# Patient Record
Sex: Female | Born: 1985 | Race: White | Hispanic: No | Marital: Married | Smoking: Never smoker
Health system: Southern US, Community
[De-identification: ages and names within clinical notes are randomized; demographics above are authoritative.]

## PROBLEM LIST (undated history)

## (undated) ENCOUNTER — Inpatient Hospital Stay (HOSPITAL_COMMUNITY): Payer: Self-pay

## (undated) DIAGNOSIS — O24419 Gestational diabetes mellitus in pregnancy, unspecified control: Secondary | ICD-10-CM

## (undated) DIAGNOSIS — I10 Essential (primary) hypertension: Secondary | ICD-10-CM

## (undated) HISTORY — PX: WISDOM TOOTH EXTRACTION: SHX21

---

## 2018-11-16 NOTE — L&D Delivery Note (Deleted)
Pt was admitted after she underwent a successful version. She was then started on pit. She rapidly completed the first stage. She had SROM with clear fluid. During the second stage she developed deep varible decels. The VE was placed at +2 station OP. She delivered with one ctx. The baby rotated to OA during the delivery. Nuchal cord x 1. Placenta- S/I EBL-400cc. Baby to NBN 

## 2019-03-01 LAB — OB RESULTS CONSOLE HEPATITIS B SURFACE ANTIGEN: Hepatitis B Surface Ag: NEGATIVE

## 2019-03-01 LAB — OB RESULTS CONSOLE GC/CHLAMYDIA
Chlamydia: NEGATIVE
Gonorrhea: NEGATIVE

## 2019-03-01 LAB — OB RESULTS CONSOLE HIV ANTIBODY (ROUTINE TESTING): HIV: NONREACTIVE

## 2019-03-01 LAB — OB RESULTS CONSOLE ANTIBODY SCREEN: Antibody Screen: NEGATIVE

## 2019-03-01 LAB — OB RESULTS CONSOLE ABO/RH: RH Type: POSITIVE

## 2019-03-01 LAB — OB RESULTS CONSOLE RPR: RPR: NONREACTIVE

## 2019-07-07 ENCOUNTER — Inpatient Hospital Stay (HOSPITAL_COMMUNITY)
Admission: AD | Admit: 2019-07-07 | Payer: Managed Care, Other (non HMO) | Source: Home / Self Care | Admitting: Obstetrics and Gynecology

## 2019-07-26 ENCOUNTER — Other Ambulatory Visit: Payer: Self-pay

## 2019-07-26 ENCOUNTER — Encounter: Payer: Managed Care, Other (non HMO) | Attending: Obstetrics and Gynecology | Admitting: Registered"

## 2019-07-26 ENCOUNTER — Encounter: Payer: Self-pay | Admitting: Registered"

## 2019-07-26 DIAGNOSIS — O99810 Abnormal glucose complicating pregnancy: Secondary | ICD-10-CM | POA: Insufficient documentation

## 2019-07-26 NOTE — Progress Notes (Signed)
Patient was seen on 07/26/19 for Gestational Diabetes self-management class at the [facility]. The following learning objectives were met by the patient during this course:   States the definition of Gestational Diabetes  States why dietary management is important in controlling blood glucose  Describes the effects each nutrient has on blood glucose levels  Demonstrates ability to create a balanced meal plan  Demonstrates carbohydrate counting   States when to check blood glucose levels  Demonstrates proper blood glucose monitoring techniques  States the effect of stress and exercise on blood glucose levels  States the importance of limiting caffeine and abstaining from alcohol and smoking  Blood glucose monitor given: [employer] Lot # [id number] X Exp: 01/14/20 Blood glucose reading: 83 mg/dL  Patient instructed to monitor glucose levels: FBS: 60 - <95; 1 hour: <140; 2 hour: <120  Patient received handouts:  Nutrition Diabetes and Pregnancy, including carb counting list  Patient will be seen for follow-up as needed.

## 2019-08-28 ENCOUNTER — Inpatient Hospital Stay (HOSPITAL_COMMUNITY)
Admission: AD | Admit: 2019-08-28 | Discharge: 2019-09-01 | DRG: 832 | Disposition: A | Discharge status: Home / Self Care | Payer: Managed Care, Other (non HMO) | Attending: Obstetrics | Admitting: Obstetrics

## 2019-08-28 ENCOUNTER — Inpatient Hospital Stay (HOSPITAL_COMMUNITY)
Admission: AD | Admit: 2019-08-28 | Payer: Managed Care, Other (non HMO) | Source: Home / Self Care | Admitting: Obstetrics & Gynecology

## 2019-08-28 ENCOUNTER — Other Ambulatory Visit: Payer: Self-pay

## 2019-08-28 ENCOUNTER — Encounter (HOSPITAL_COMMUNITY): Payer: Self-pay | Admitting: *Deleted

## 2019-08-28 DIAGNOSIS — O10013 Pre-existing essential hypertension complicating pregnancy, third trimester: Secondary | ICD-10-CM | POA: Diagnosis present

## 2019-08-28 DIAGNOSIS — O4100X0 Oligohydramnios, unspecified trimester, not applicable or unspecified: Secondary | ICD-10-CM

## 2019-08-28 DIAGNOSIS — O288 Other abnormal findings on antenatal screening of mother: Secondary | ICD-10-CM

## 2019-08-28 DIAGNOSIS — Z3A35 35 weeks gestation of pregnancy: Secondary | ICD-10-CM | POA: Diagnosis not present

## 2019-08-28 DIAGNOSIS — O24410 Gestational diabetes mellitus in pregnancy, diet controlled: Secondary | ICD-10-CM | POA: Diagnosis present

## 2019-08-28 DIAGNOSIS — O321XX0 Maternal care for breech presentation, not applicable or unspecified: Secondary | ICD-10-CM | POA: Diagnosis present

## 2019-08-28 DIAGNOSIS — O99820 Streptococcus B carrier state complicating pregnancy: Secondary | ICD-10-CM | POA: Diagnosis present

## 2019-08-28 DIAGNOSIS — Z20828 Contact with and (suspected) exposure to other viral communicable diseases: Secondary | ICD-10-CM | POA: Diagnosis present

## 2019-08-28 DIAGNOSIS — I10 Essential (primary) hypertension: Secondary | ICD-10-CM

## 2019-08-28 DIAGNOSIS — O4103X0 Oligohydramnios, third trimester, not applicable or unspecified: Secondary | ICD-10-CM | POA: Diagnosis present

## 2019-08-28 DIAGNOSIS — O4103X1 Oligohydramnios, third trimester, fetus 1: Secondary | ICD-10-CM | POA: Diagnosis present

## 2019-08-28 DIAGNOSIS — Z3A36 36 weeks gestation of pregnancy: Secondary | ICD-10-CM | POA: Diagnosis not present

## 2019-08-28 DIAGNOSIS — O24419 Gestational diabetes mellitus in pregnancy, unspecified control: Secondary | ICD-10-CM

## 2019-08-28 HISTORY — DX: Gestational diabetes mellitus in pregnancy, unspecified control: O24.419

## 2019-08-28 HISTORY — DX: Essential (primary) hypertension: I10

## 2019-08-28 LAB — COMPREHENSIVE METABOLIC PANEL
ALT: 20 U/L (ref 0–44)
AST: 23 U/L (ref 15–41)
Albumin: 2.7 g/dL — ABNORMAL LOW (ref 3.5–5.0)
Alkaline Phosphatase: 76 U/L (ref 38–126)
Anion gap: 11 (ref 5–15)
BUN: 10 mg/dL (ref 6–20)
CO2: 18 mmol/L — ABNORMAL LOW (ref 22–32)
Calcium: 8.9 mg/dL (ref 8.9–10.3)
Chloride: 107 mmol/L (ref 98–111)
Creatinine, Ser: 0.57 mg/dL (ref 0.44–1.00)
GFR calc Af Amer: >60 mL/min (ref >60)
GFR calc non Af Amer: >60 mL/min (ref >60)
Glucose, Bld: 78 mg/dL (ref 70–99)
Potassium: 3.2 mmol/L — ABNORMAL LOW (ref 3.5–5.1)
Sodium: 136 mmol/L (ref 135–145)
Total Bilirubin: 0.5 mg/dL (ref 0.3–1.2)
Total Protein: 6.1 g/dL — ABNORMAL LOW (ref 6.5–8.1)

## 2019-08-28 LAB — CBC
HCT: 30.6 % — ABNORMAL LOW (ref 36.0–46.0)
Hemoglobin: 10.2 g/dL — ABNORMAL LOW (ref 12.0–15.0)
MCH: 32.1 pg (ref 26.0–34.0)
MCHC: 33.3 g/dL (ref 30.0–36.0)
MCV: 96.2 fL (ref 80.0–100.0)
Platelets: 226 10*3/uL (ref 150–400)
RBC: 3.18 MIL/uL — ABNORMAL LOW (ref 3.87–5.11)
RDW: 12.7 % (ref 11.5–15.5)
WBC: 9.9 10*3/uL (ref 4.0–10.5)
nRBC: 0 % (ref 0.0–0.2)

## 2019-08-28 LAB — ABO/RH: ABO/RH(D): A POS

## 2019-08-28 LAB — TYPE AND SCREEN
ABO/RH(D): A POS
Antibody Screen: NEGATIVE

## 2019-08-28 LAB — SARS CORONAVIRUS 2 BY RT PCR (HOSPITAL ORDER, PERFORMED IN ~~LOC~~ HOSPITAL LAB): SARS Coronavirus 2: NEGATIVE

## 2019-08-28 MED ORDER — DOCUSATE SODIUM 100 MG PO CAPS
100.0000 mg | ORAL_CAPSULE | Freq: Every day | ORAL | Status: DC
  Administered 2019-08-29 – 2019-09-01 (×4): 100 mg via ORAL
  Filled 2019-08-28 (×4): qty 1

## 2019-08-28 MED ORDER — CALCIUM CARBONATE ANTACID 500 MG PO CHEW
2.0000 | CHEWABLE_TABLET | ORAL | Status: DC | PRN
  Administered 2019-08-28: 400 mg via ORAL
  Filled 2019-08-28: qty 2

## 2019-08-28 MED ORDER — PRENATAL MULTIVITAMIN CH
1.0000 | ORAL_TABLET | Freq: Every day | ORAL | Status: DC
  Administered 2019-08-29 – 2019-08-31 (×3): 1 via ORAL
  Filled 2019-08-28 (×2): qty 1

## 2019-08-28 MED ORDER — LABETALOL HCL 200 MG PO TABS
200.0000 mg | ORAL_TABLET | Freq: Two times a day (BID) | ORAL | Status: DC
  Administered 2019-08-28 – 2019-09-01 (×8): 200 mg via ORAL
  Filled 2019-08-28 (×8): qty 1

## 2019-08-28 MED ORDER — ASPIRIN EC 81 MG PO TBEC
81.0000 mg | DELAYED_RELEASE_TABLET | Freq: Every day | ORAL | Status: DC
  Administered 2019-08-29 – 2019-09-01 (×4): 81 mg via ORAL
  Filled 2019-08-28 (×4): qty 1

## 2019-08-28 MED ORDER — ACETAMINOPHEN 325 MG PO TABS: 650.0000 mg | ORAL_TABLET | ORAL | Status: DC | PRN

## 2019-08-28 NOTE — H&P (Signed)
33 y.o. G2P0010 @ [redacted]w[redacted]d presents from the office for admission for new onset oligohydramnios.  Her pregnancy has been complicated by GDMA1 and CHTN for which she is on labetalol 200 mg PO BID.  She was undergoing routine antepartum surveillance and noted to have borderline oligo last week with DVP 2.6 cm and AFI of 5cm.  Today, AFI 3.78, DVP is 1.71, BPP 8/10 (-2 for breathing). Findings were discussed with MFM (Dr. Donalee Citrin).  He recommended antepartum admission with daily NST and delivery at 37 weeks.   Alternatively, delivery at 36 weeks with understanding the increaesd risk of respiratory distress.  Patient elected for antepartum admission.   Otherwise has good fetal movement and no bleeding.    Past Medical History:  Diagnosis Date  . Gestational diabetes   . Hypertension     Past Surgical History:  Procedure Laterality Date  . WISDOM TOOTH EXTRACTION      OB History  Gravida Para Term Preterm AB Living  2       1    SAB TAB Ectopic Multiple Live Births  1            # Outcome Date GA Lbr Len/2nd Weight Sex Delivery Anes PTL Lv  2 Current           1 SAB 09/27/18            Social History   Socioeconomic History  . Marital status: Married    Spouse name: Not on file  . Number of children: Not on file  . Years of education: Not on file  . Highest education level: Not on file  Occupational History  . Not on file  Social Needs  . Financial resource strain: Not on file  . Food insecurity    Worry: Not on file    Inability: Not on file  . Transportation needs    Medical: Not on file    Non-medical: Not on file  Tobacco Use  . Smoking status: Never Smoker  . Smokeless tobacco: Never Used  Substance and Sexual Activity  . Alcohol use: Never    Frequency: Never  . Drug use: Never  . Sexual activity: Yes   Patient has no known allergies.    Prenatal Transfer Tool  Maternal Diabetes: Yes:  Diabetes Type:  Diet controlled Genetic Screening: Normal Maternal  Ultrasounds/Referrals: Normal fetal anatomy, new onset oligo as above Fetal Ultrasounds or other Referrals:  None Maternal Substance Abuse:  No Significant Maternal Medications:  Meds include: Other:  Significant Maternal Lab Results: Group B Strep positive  ABO, Rh: --/--/PENDING (10/12 2033) Antibody: PENDING (10/12 2033) Rubella:   immune RPR:   NR HBsAg:   Neg HIV:   Neg GBS:   Positive        Vitals:   08/28/19 2005  BP: (!) 143/83  Pulse: 77  Resp: 18  Temp: 98.4 F (36.9 C)  SpO2: 99%     General:  NAD FHTs:  130s, moderate variability, + accelerations, category 1 Toco:  q5 minutes  Growth [place] 9/25:  EFW 4lb 14oz (47%)  A/P   33 y.o. G2P0010 at [redacted]w[redacted]d presents with new onset oligohydramnios Admit to AP Per MFM recommendation, daily fetal survelliance with delivery at 37 weeks, or sooner as indicated by fetal testing.  MFM recommends repeating AFI in 3 days. GDMA1: 4x/day FSBG CHTN: continue labetalol 200mg  bid and aspiring 81 mg daily Breech presentation GBS positive by urine  [place]

## 2019-08-28 NOTE — Plan of Care (Signed)
  Problem: Education: Goal: Knowledge of General Education information will improve Description: Including pain rating scale, medication(s)/side effects and non-pharmacologic comfort measures Outcome: Completed/Met

## 2019-08-29 LAB — GLUCOSE, CAPILLARY
Glucose-Capillary: 88 mg/dL (ref 70–99)
Glucose-Capillary: 84 mg/dL (ref 70–99)
Glucose-Capillary: 121 mg/dL — ABNORMAL HIGH (ref 70–99)
Glucose-Capillary: 109 mg/dL — ABNORMAL HIGH (ref 70–99)

## 2019-08-29 MED ORDER — BETAMETHASONE SOD PHOS & ACET 6 (3-3) MG/ML IJ SUSP
12.0000 mg | INTRAMUSCULAR | Status: AC
  Administered 2019-08-29 – 2019-08-30 (×2): 12 mg via INTRAMUSCULAR
  Filled 2019-08-29 (×2): qty 2

## 2019-08-29 MED ORDER — FERROUS SULFATE 325 (65 FE) MG PO TABS
325.0000 mg | ORAL_TABLET | Freq: Every day | ORAL | Status: DC
  Administered 2019-08-29 – 2019-08-31 (×3): 325 mg via ORAL
  Filled 2019-08-29 (×3): qty 1

## 2019-08-29 NOTE — Progress Notes (Signed)
33 y.o. G2P0010 [redacted]w[redacted]d HD#1 admitted for oligo in setting of CHTN and A1GDM.    Pt currently stable with no c/o.  Good FM.  Vitals:   08/28/19 2005 08/28/19 2210 08/29/19 0328 08/29/19 0631  BP: (!) 143/83 124/66 131/67 (!) 148/83  Pulse: 77 61 60 61  Resp: 18  16   Temp: 98.4 F (36.9 C)  98.2 F (36.8 C)   TempSrc: Oral  Oral   SpO2: 99%  100%   Weight: 73.9 kg     Height: 5\' 1"  (1.549 m)       Lungs CTA Cor RRR Abd  Soft, gravid, nontender Ex SCDs FHTs  Last night 120s, good short term variability, NST R Toco  Last night q 5 min, now occ  Results for orders placed or performed during the hospital encounter of 08/28/19 (from the past 24 hour(s))  SARS Coronavirus 2 by RT PCR (hospital order, performed in [place] hospital lab) Nasopharyngeal Nasopharyngeal Swab     Status: None   Collection Time: 08/28/19  7:40 PM   Specimen: Nasopharyngeal Swab  Result Value Ref Range   SARS Coronavirus 2 NEGATIVE NEGATIVE  Type and screen [place]     Status: None   Collection Time: 08/28/19  8:33 PM  Result Value Ref Range   ABO/RH(D) A POS    Antibody Screen NEG    Sample Expiration      08/31/2019,2359 Performed at [facility], [address], [place], [place] [address]   Comprehensive metabolic panel     Status: Abnormal   Collection Time: 08/28/19  8:33 PM  Result Value Ref Range   Sodium 136 135 - 145 mmol/L   Potassium 3.2 (L) 3.5 - 5.1 mmol/L   Chloride 107 98 - 111 mmol/L   CO2 18 (L) 22 - 32 mmol/L   Glucose, Bld 78 70 - 99 mg/dL   BUN 10 6 - 20 mg/dL   Creatinine, Ser 0.57 0.44 - 1.00 mg/dL   Calcium 8.9 8.9 - 10.3 mg/dL   Total Protein 6.1 (L) 6.5 - 8.1 g/dL   Albumin 2.7 (L) 3.5 - 5.0 g/dL   AST 23 15 - 41 U/L   ALT 20 0 - 44 U/L   Alkaline Phosphatase 76 38 - 126 U/L   Total Bilirubin 0.5 0.3 - 1.2 mg/dL   GFR calc non Af Amer >60 >60 mL/min   GFR calc Af Amer >60 >60 mL/min   Anion gap 11 5 - 15  CBC on admission     Status:  Abnormal   Collection Time: 08/28/19  8:33 PM  Result Value Ref Range   WBC 9.9 4.0 - 10.5 K/uL   RBC 3.18 (L) 3.87 - 5.11 MIL/uL   Hemoglobin 10.2 (L) 12.0 - 15.0 g/dL   HCT 30.6 (L) 36.0 - 46.0 %   MCV 96.2 80.0 - 100.0 fL   MCH 32.1 26.0 - 34.0 pg   MCHC 33.3 30.0 - 36.0 g/dL   RDW 12.7 11.5 - 15.5 %   Platelets 226 150 - 400 K/uL   nRBC 0.0 0.0 - 0.2 %  ABO/Rh     Status: None   Collection Time: 08/28/19  8:33 PM  Result Value Ref Range   ABO/RH(D)      A POS Performed at [facility] [address] [place], [place] [address]   Glucose, capillary     Status: None   Collection Time: 08/29/19  6:12 AM  Result Value Ref Range   Glucose-Capillary 88 70 - 99 mg/dL    A:  HD#1  [redacted]w[redacted]d with oligo, CHTN, GDM and advanced cervical dlation.  P: Per MFM recommendation, daily fetal survelliance with delivery at 37 weeks, or sooner as indicated by fetal testing.  MFM recommends repeating AFI in 3 days. GDMA1: 4x/day FSBG CHTN: continue labetalol 200mg  bid and aspiring 81 mg daily Breech presentation GBS positive by urine   

## 2019-08-30 LAB — GLUCOSE, CAPILLARY
Glucose-Capillary: 104 mg/dL — ABNORMAL HIGH (ref 70–99)
Glucose-Capillary: 133 mg/dL — ABNORMAL HIGH (ref 70–99)
Glucose-Capillary: 125 mg/dL — ABNORMAL HIGH (ref 70–99)
Glucose-Capillary: 143 mg/dL — ABNORMAL HIGH (ref 70–99)

## 2019-08-30 NOTE — Plan of Care (Signed)
  Problem: Clinical Measurements: Goal: Complications related to the disease process, condition or treatment will be avoided or minimized Outcome: Progressing  Pt. Doing well this AM. Reports good FM and no new symptoms of PIH, pain or leaking/bleeding. Fasting CBG 104 this AM. Plan for 2nd dose of BMZ at 10A.

## 2019-08-30 NOTE — Progress Notes (Signed)
Pt states that she is doing well. She has nl FM. States BSs are ok. Getting second dose of steroids today. Per MFM will repeat AFI tomorrow VSSAF IMP/ Stable Plan/ Check AFI           Induce at 37 wks

## 2019-08-31 ENCOUNTER — Inpatient Hospital Stay (HOSPITAL_COMMUNITY): Payer: Managed Care, Other (non HMO)

## 2019-08-31 DIAGNOSIS — O10013 Pre-existing essential hypertension complicating pregnancy, third trimester: Secondary | ICD-10-CM

## 2019-08-31 DIAGNOSIS — O24410 Gestational diabetes mellitus in pregnancy, diet controlled: Secondary | ICD-10-CM

## 2019-08-31 DIAGNOSIS — O4103X0 Oligohydramnios, third trimester, not applicable or unspecified: Secondary | ICD-10-CM

## 2019-08-31 DIAGNOSIS — Z3A36 36 weeks gestation of pregnancy: Secondary | ICD-10-CM

## 2019-08-31 LAB — GLUCOSE, CAPILLARY
Glucose-Capillary: 96 mg/dL (ref 70–99)
Glucose-Capillary: 107 mg/dL — ABNORMAL HIGH (ref 70–99)
Glucose-Capillary: 110 mg/dL — ABNORMAL HIGH (ref 70–99)
Glucose-Capillary: 107 mg/dL — ABNORMAL HIGH (ref 70–99)

## 2019-08-31 LAB — RPR: RPR Ser Ql: NONREACTIVE

## 2019-08-31 MED ORDER — HYDROCORTISONE 1 % EX CREA
TOPICAL_CREAM | Freq: Three times a day (TID) | CUTANEOUS | Status: DC | PRN
  Filled 2019-08-31: qty 28

## 2019-08-31 MED ORDER — DIPHENHYDRAMINE HCL 25 MG PO CAPS
25.0000 mg | ORAL_CAPSULE | Freq: Four times a day (QID) | ORAL | Status: DC | PRN
  Administered 2019-08-31: 25 mg via ORAL
  Filled 2019-08-31: qty 1

## 2019-08-31 NOTE — Progress Notes (Signed)
HD#4 36.2 CHTN, GDMA1, oligo Pt doing well without complaints.  No LOF, VB or contractions.  No HA/vision change or RUQ pain. FHT: NST Cat 1 TOCO quite SVE: deferred A/P:  S/p MFM consult- discussed with Dr. Donalee Citrin, per his repeat scan today, oligo has improved with AFI of 5 and DVP 3cm. He advised option of d/c home with close f/u or continue inpatient monitoring if pt is concerned.  Pt elects to remain in patient for now.  He recommended BPP Friday, ordered. Cont labetalol 200 bid ALT/AST/PLT on 08/28/2019- wnl

## 2019-08-31 NOTE — Progress Notes (Signed)
Initial Nutrition Assessment  DOCUMENTATION CODES:   Not applicable  INTERVENTION:  CHO modified gestational diabetic diet  NUTRITION DIAGNOSIS:  Altered nutrition lab value related to (gestational diabetes) as evidenced by (abn GTT).  GOAL:  Patient will meet greater than or equal to 90% of their needs, Weight gain  MONITOR:  Labs  REASON FOR ASSESSMENT:  Antenatal, Gestational Diabetes    ASSESSMENT:  Adm at 35 6/7 weeks with oglio, HTN, diet controlled GDM. No pre-preg wt available. In house until delivery at 37 weeks  Diet Order:   Diet Order            Diet gestational carb mod Room service appropriate? Yes; Fluid consistency: Thin  Diet effective now             EDUCATION NEEDS:  Education needs have been addressed(outpt educ 07/26/2019)  Height:   Ht Readings from Last 1 Encounters:  08/28/19 5\' 1"  (1.549 m)   Weight:   Wt Readings from Last 1 Encounters:  08/28/19 73.9 kg    Ideal Body Weight:   105 lbs  BMI:  Body mass index is 30.8 kg/m.  Estimated Nutritional Needs:   Kcal:  1800-2000  Protein:  80-90 g  Fluid:  2.1L   Weyman Rodney M.Fredderick Severance LDN Neonatal Nutrition Support Specialist/RD III Pager [phone]      Phone [phone]

## 2019-09-01 ENCOUNTER — Inpatient Hospital Stay (HOSPITAL_COMMUNITY): Payer: Managed Care, Other (non HMO)

## 2019-09-01 DIAGNOSIS — O4103X0 Oligohydramnios, third trimester, not applicable or unspecified: Secondary | ICD-10-CM

## 2019-09-01 DIAGNOSIS — Z3A36 36 weeks gestation of pregnancy: Secondary | ICD-10-CM

## 2019-09-01 DIAGNOSIS — I10 Essential (primary) hypertension: Secondary | ICD-10-CM

## 2019-09-01 DIAGNOSIS — O24419 Gestational diabetes mellitus in pregnancy, unspecified control: Secondary | ICD-10-CM

## 2019-09-01 DIAGNOSIS — O10013 Pre-existing essential hypertension complicating pregnancy, third trimester: Secondary | ICD-10-CM

## 2019-09-01 DIAGNOSIS — O24410 Gestational diabetes mellitus in pregnancy, diet controlled: Secondary | ICD-10-CM

## 2019-09-01 LAB — TYPE AND SCREEN
ABO/RH(D): A POS
Antibody Screen: NEGATIVE

## 2019-09-01 LAB — RUBELLA SCREEN: Rubella: 2.5 index (ref >0.99)

## 2019-09-01 LAB — GLUCOSE, CAPILLARY
Glucose-Capillary: 90 mg/dL (ref 70–99)
Glucose-Capillary: 87 mg/dL (ref 70–99)

## 2019-09-01 NOTE — Discharge Instructions (Signed)
Oligohydramnios Oligohydramnios is a condition in which there is not enough fluid in the sac (amniotic sac) that surrounds your unborn baby (fetus) in the uterus. The amniotic sac contains fluid (amniotic fluid) that:  Protects your baby from injury (trauma) and infections.  Helps your baby move freely inside the uterus.  Helps your baby's lungs, kidneys, and digestive system to develop. This condition can interfere with your baby's normal prenatal growth and development. It can happen any time during pregnancy but is most common during the last 3 months (third trimester). Oligohydramnios is most likely to cause serious problems when it occurs early in pregnancy. Some problems that can result from this condition include:  Early (premature) birth.  Birth defects.  Limited (restricted) fetal growth.  Decreased oxygen flow to the fetus due to pressure on the umbilical cord.  Pregnancy loss (miscarriage).  Stillbirth. What are the causes? This condition may be caused by:  A leak or tear in the amniotic sac.  A problem with the organ that nourishes the baby in the uterus (placenta), such as failure of the placenta to provide enough blood, fluid, and nutrients to the baby.  Having identical twins who share the same placenta.  A fetal birth defect. This is usually an abnormality in the fetal kidneys or urinary tract.  A pregnancy that goes 2 weeks or more past the due date.  A condition that the mother has, such as: ? A lack of fluids in the body (dehydration). ? High blood pressure. ? Diabetes. ? Reactions to certain medicines, such as ibuprofen or blood pressure medicines (ACE inhibitors). ? Systemic lupus. In some cases, the cause is not known. What increases the risk? This condition is more likely to develop if you:  Become dehydrated.  Have high blood pressure.  Take NSAIDs or ACE inhibitors.  Have diabetes or lupus.  Have poor prenatal care.  Have a clotting  disorder. What are the signs or symptoms? In most cases, there are no symptoms of oligohydramnios. If you do have symptoms, they may include:  Fluid leaking from the vagina.  Having a uterus that is smaller than normal.  Feeling less movement of your baby in your uterus. How is this diagnosed? This condition may be diagnosed by measuring the amount of amniotic fluid in your amniotic sac using the amniotic fluid index (AFI). The AFI is a measurement that is done during a prenatal ultrasound test that uses painless, harmless sound waves to create an image of your uterus and your baby. This prenatal ultrasound may be done to:  Measure the amniotic fluid level.  Check your baby's kidneys and your baby's growth.  Evaluate the placenta. You may also have other tests to find the cause of oligohydramnios. How is this treated? Treatment for this condition depends on how low your amniotic fluid level is, how far along you are in your pregnancy, and your overall health. Treatment may include:  Having your health care provider monitor your condition more closely than usual. You may have more frequent appointments and more AFI ultrasound measurements.  Increasing the amount of fluid in your body. This may be done by having you drink more fluids or by giving you fluids through an IV that is inserted into one of your veins.  Injecting fluid into your amniotic sac during delivery (amnioinfusion).  Having your baby delivered early, if you are close to your due date. Follow these instructions at home: Lifestyle  Do not drink alcohol. No amount of alcohol is  safe during pregnancy.  Do not use any products that contain nicotine or tobacco, such as cigarettes, e-cigarettes, and chewing tobacco. If you need help quitting, ask your health care provider.  Do not use any illegal drugs. These can harm your developing baby or cause a miscarriage. General instructions      Take over-the-counter and  prescription medicines only as told by your health care provider.  Follow instructions from your health care provider about physical activity and rest. Your health care provider may recommend that you stay in bed (be on bed rest).  Follow instructions from your health care provider about eating or drinking restrictions.  Eat healthy foods, including a balance of fruits and vegetables, lean proteins, whole grains, and low-fat or nonfat dairy products.  Drink enough fluid to keep your urine pale yellow.  Keep all prenatal care appointments with your health care provider. This is important. Contact a health care provider if:  You notice that your baby seems to be moving less than usual. Get help right away if:  You have fluid leaking from your vagina.  You start to have labor pains (contractions). This may feel like a sense of tightening in your lower abdomen.  You have a fever. Summary  Oligohydramnios is a condition in which there is not enough fluid in the amniotic sac that surrounds your unborn baby in the uterus. It can interfere with your baby's normal growth and development.  This condition is most common in the last 3 months of pregnancy but can occur at any time. If it occurs early in pregnancy, oligohydramnios can lead to serious problems.  Do not drink alcohol or use nicotine, tobacco, or illegal drugs.  Keep your regular prenatal appointments, eat healthy, and drink enough fluid to keep your urine pale yellow.  Contact your health care provider if you notice that your baby seems to be moving less than usual. Get help right away if you have fluid leaking from your vagina, you sense a tightening in your lower abdomen, or you have a fever. This information is not intended to replace advice given to you by your health care provider. Make sure you discuss any questions you have with your health care provider. Document Released: 02/17/2011 Document Revised: 04/13/2018 Document  Reviewed: 04/13/2018 Elsevier Patient Education  2020 [employer].

## 2019-09-01 NOTE — Plan of Care (Signed)
Pt. Discharged by Dr. Wendi Snipes. BPP completed. Pt. Will be contacted Monday or follow up by end of business Monday for appt. Discharge education complete, and all questions answered.

## 2019-09-01 NOTE — Discharge Summary (Signed)
    OB Discharge Summary     Patient Name: Anna Nelson DOB: [birth year] MRN: [id number]  Date of admission: 08/28/2019 Date of discharge: 09/01/2019  Admitting diagnosis: Preg Intrauterine pregnancy: [redacted]w[redacted]d     Secondary diagnosis:  Active Problems:   Oligohydramnios antepartum, third trimester, fetus 1   Gestational diabetes mellitus   Hypertension, essential      Discharge diagnosis: CHTN and GDM A1                                   Hospital course: Nyna Chilton 33 y.o. G2P0010 was admitted 08/28/2019 due to oligohydramnios. Per MFM recommendations, daily fetal testing, consider delivery at 37w vs. Offer delivery at [place]. Patient opted for daily testing. Her fetal testing was Cat 1. Repeat BPP performed by MFM 09/01/2019 showed that oligo had resolved, BPP 8/8. Recommended that discharge was appropriate, with consideration of delivery 37-38w GA due to oligo and CHTN. On day of discharge, fetal tracing cat 1, Bps appropriate on labetalol, patient desired discharge. Plan for F/u Monday or Tuesday for BPP/OB visit and then schedule CS on 10/22 or 10/23.   Physical exam  Vitals:   08/31/19 2324 09/01/19 0607 09/01/19 0846 09/01/19 0900  BP:  (!) 143/83 131/74 (!) 148/77  Pulse: 72 67 (!) 56 (!) 55  Resp: 16 17 18 17   Temp: 98.2 F (36.8 C) 98 F (36.7 C) 98.7 F (37.1 C) 98.9 F (37.2 C)  TempSrc: Oral Oral Oral Oral  SpO2: 97% 98% 100% 99%  Weight:      Height:       Labs: Lab Results  Component Value Date   WBC 9.9 08/28/2019   HGB 10.2 (L) 08/28/2019   HCT 30.6 (L) 08/28/2019   MCV 96.2 08/28/2019   PLT 226 08/28/2019   CMP Latest Ref Rng & Units 08/28/2019  Glucose 70 - 99 mg/dL 78  BUN 6 - 20 mg/dL 10  Creatinine 0.44 - 1.00 mg/dL 0.57  Sodium 135 - 145 mmol/L 136  Potassium 3.5 - 5.1 mmol/L 3.2(L)  Chloride 98 - 111 mmol/L 107  CO2 22 - 32 mmol/L 18(L)  Calcium 8.9 - 10.3 mg/dL 8.9  Total Protein 6.5 - 8.1 g/dL 6.1(L)  Total Bilirubin 0.3 - 1.2 mg/dL  0.5  Alkaline Phos 38 - 126 U/L 76  AST 15 - 41 U/L 23  ALT 0 - 44 U/L 20    Discharge instruction: Daily fetal kick counts  After visit meds:  Allergies as of 09/01/2019   No Known Allergies     Medication List    TAKE these medications   aspirin EC 81 MG tablet Take 81 mg by mouth daily.   ferrous sulfate 325 (65 FE) MG tablet Take 325 mg by mouth daily with breakfast.   labetalol 200 MG tablet Commonly known as: NORMODYNE Take 200 mg by mouth 2 (two) times daily.   prenatal multivitamin Tabs tablet Take 1 tablet by mouth daily at 12 noon.       09/01/2019 Jonelle Sidle, MD

## 2019-09-01 NOTE — Progress Notes (Signed)
OB Antepartum Progress note Anna Nelson 33 y.o. G2P0010 at [redacted]w[redacted]d, admitted for monitoring due to oligo on 08/28/2019. S: reports doing fine, normal FM, no ctx, LOF, VB. O: Vitals:   08/31/19 2324 09/01/19 0607 09/01/19 0846 09/01/19 0900  BP:  (!) 143/83 131/74 (!) 148/77  Pulse: 72 67 (!) 56 (!) 55  Resp: 16 17 18 17   Temp: 98.2 F (36.8 C) 98 F (36.7 C) 98.7 F (37.1 C) 98.9 F (37.2 C)  TempSrc: Oral Oral Oral Oral  SpO2: 97% 98% 100% 99%  Weight:      Height:       Fetal testing:  NST 135, +accels, no decels. Cat 1 Toco quiet  BPP today: 8/8, AFI 7.67 (DVP 3.45)  A/P: [name] 33 y.o. G2P0010 at [redacted]w[redacted]d, monitoring in s/o oligo 1. Oligo: now resolved, per MFM given that she had oligo reasonable to deliver between 37-38w. CS 2/2 breech. After discussion with patient will post for CS 37-38w. Plan for visit Monday or Tuesday for fetal testing.  2. CHTN: BP controlled on labetalol 200mg  BID 3. GDMA1: cont to check BG 4x day 4. Prematurity: sp BMZ 10/13 and 10/14  Discharge home, counseled on importance of fetal kick counts Danique Hartsough K Taam-Akelman 09/01/19 11:26 AM

## 2019-09-01 NOTE — Plan of Care (Signed)
Pt. Doing well this AM. MFM [place] w/ BPP finished. Reports good fetal movement, no s/s of PIH, no reports of vaginal bldg or leaking. Will monitor.

## 2019-09-04 ENCOUNTER — Telehealth (HOSPITAL_COMMUNITY): Payer: Self-pay | Admitting: *Deleted

## 2019-09-04 ENCOUNTER — Other Ambulatory Visit (HOSPITAL_COMMUNITY)
Admission: RE | Admit: 2019-09-04 | Discharge: 2019-09-04 | Disposition: A | Discharge status: Home / Self Care | Payer: Managed Care, Other (non HMO) | Source: Ambulatory Visit | Attending: Family Medicine | Admitting: Family Medicine

## 2019-09-04 ENCOUNTER — Other Ambulatory Visit: Payer: Self-pay

## 2019-09-04 ENCOUNTER — Encounter (HOSPITAL_COMMUNITY): Payer: Self-pay

## 2019-09-04 ENCOUNTER — Other Ambulatory Visit: Payer: Self-pay | Admitting: Obstetrics and Gynecology

## 2019-09-04 LAB — CBC
HCT: 31.9 % — ABNORMAL LOW (ref 36.0–46.0)
Hemoglobin: 10.7 g/dL — ABNORMAL LOW (ref 12.0–15.0)
MCH: 31.8 pg (ref 26.0–34.0)
MCHC: 33.5 g/dL (ref 30.0–36.0)
MCV: 94.9 fL (ref 80.0–100.0)
Platelets: 213 10*3/uL (ref 150–400)
RBC: 3.36 MIL/uL — ABNORMAL LOW (ref 3.87–5.11)
RDW: 12.9 % (ref 11.5–15.5)
WBC: 10.2 10*3/uL (ref 4.0–10.5)
nRBC: 0 % (ref 0.0–0.2)

## 2019-09-04 LAB — TYPE AND SCREEN
ABO/RH(D): A POS
Antibody Screen: NEGATIVE

## 2019-09-04 LAB — SARS CORONAVIRUS 2 BY RT PCR (HOSPITAL ORDER, PERFORMED IN ~~LOC~~ HOSPITAL LAB): SARS Coronavirus 2: NEGATIVE

## 2019-09-04 NOTE — Patient Instructions (Signed)
Ayannah Faddis  09/04/2019   Your procedure is scheduled on:  09/06/2019  Arrive at 3:15pm at Entrance C on [address] at [facility]  and [employer]. You are invited to use the FREE valet parking or use the Visitor's parking deck.  Pick up the phone at the desk and dial [phone].  Call this number if you have problems the morning of surgery: [phone]  Remember:   Do not eat food:(After Midnight) Desps de medianoche.  Do not drink clear liquids: (6 Hours before arrival) 6 horas ante llegada.  Take these medicines the morning of surgery with A SIP OF WATER:  Take your labetalol as normal at 1130 with a sip of water   Do not wear jewelry, make-up or nail polish.  Do not wear lotions, powders, or perfumes. Do not wear deodorant.  Do not shave 48 hours prior to surgery.  Do not bring valuables to the hospital.  [facility] is not   responsible for any belongings or valuables brought to the hospital.  Contacts, dentures or bridgework may not be worn into surgery.  Leave suitcase in the car. After surgery it may be brought to your room.  For patients admitted to the hospital, checkout time is 11:00 AM the day of              discharge.      Please read over the following fact sheets that you were given:     Preparing for Surgery

## 2019-09-04 NOTE — Telephone Encounter (Signed)
Preadmission screen  

## 2019-09-04 NOTE — MAU Note (Signed)
Asymptomatic, swab collected. Blood work drawn. 

## 2019-09-04 NOTE — MAU Note (Signed)
Orders released.  Lab called.

## 2019-09-05 LAB — RPR: RPR Ser Ql: NONREACTIVE

## 2019-09-06 ENCOUNTER — Inpatient Hospital Stay (HOSPITAL_COMMUNITY): Payer: Managed Care, Other (non HMO) | Admitting: Anesthesiology

## 2019-09-06 ENCOUNTER — Other Ambulatory Visit: Payer: Self-pay

## 2019-09-06 ENCOUNTER — Encounter (HOSPITAL_COMMUNITY)
Admission: AD | Disposition: A | Discharge status: Home / Self Care | Payer: Self-pay | Source: Home / Self Care | Attending: Obstetrics

## 2019-09-06 ENCOUNTER — Encounter (HOSPITAL_COMMUNITY): Payer: Self-pay | Admitting: *Deleted

## 2019-09-06 ENCOUNTER — Inpatient Hospital Stay (HOSPITAL_COMMUNITY): Admit: 2019-09-06 | Payer: Managed Care, Other (non HMO) | Admitting: Rheumatology

## 2019-09-06 ENCOUNTER — Encounter (HOSPITAL_COMMUNITY): Payer: Self-pay | Admitting: Anesthesiology

## 2019-09-06 ENCOUNTER — Encounter (HOSPITAL_COMMUNITY)
Admission: AD | Disposition: A | Discharge status: Home / Self Care | Payer: Self-pay | Source: Home / Self Care | Attending: Obstetrics and Gynecology

## 2019-09-06 ENCOUNTER — Inpatient Hospital Stay (HOSPITAL_COMMUNITY)
Admission: AD | Admit: 2019-09-06 | Discharge: 2019-09-08 | DRG: 787 | Disposition: A | Discharge status: Home / Self Care | Payer: Managed Care, Other (non HMO) | Attending: Obstetrics and Gynecology | Admitting: Obstetrics and Gynecology

## 2019-09-06 DIAGNOSIS — O99824 Streptococcus B carrier state complicating childbirth: Secondary | ICD-10-CM | POA: Diagnosis present

## 2019-09-06 DIAGNOSIS — Z20828 Contact with and (suspected) exposure to other viral communicable diseases: Secondary | ICD-10-CM | POA: Diagnosis present

## 2019-09-06 DIAGNOSIS — Z3A37 37 weeks gestation of pregnancy: Secondary | ICD-10-CM

## 2019-09-06 DIAGNOSIS — O4103X0 Oligohydramnios, third trimester, not applicable or unspecified: Secondary | ICD-10-CM | POA: Diagnosis present

## 2019-09-06 DIAGNOSIS — Z3490 Encounter for supervision of normal pregnancy, unspecified, unspecified trimester: Secondary | ICD-10-CM

## 2019-09-06 DIAGNOSIS — O24420 Gestational diabetes mellitus in childbirth, diet controlled: Secondary | ICD-10-CM | POA: Diagnosis present

## 2019-09-06 DIAGNOSIS — O321XX0 Maternal care for breech presentation, not applicable or unspecified: Principal | ICD-10-CM | POA: Diagnosis present

## 2019-09-06 DIAGNOSIS — Z98891 History of uterine scar from previous surgery: Secondary | ICD-10-CM

## 2019-09-06 DIAGNOSIS — O1002 Pre-existing essential hypertension complicating childbirth: Secondary | ICD-10-CM | POA: Diagnosis present

## 2019-09-06 LAB — GLUCOSE, CAPILLARY
Glucose-Capillary: 43 mg/dL — CL (ref 70–99)
Glucose-Capillary: 131 mg/dL — ABNORMAL HIGH (ref 70–99)
Glucose-Capillary: 68 mg/dL — ABNORMAL LOW (ref 70–99)
Glucose-Capillary: 82 mg/dL (ref 70–99)

## 2019-09-06 LAB — CBC
HCT: 28.2 % — ABNORMAL LOW (ref 36.0–46.0)
Hemoglobin: 9.8 g/dL — ABNORMAL LOW (ref 12.0–15.0)
MCH: 32.9 pg (ref 26.0–34.0)
MCHC: 34.8 g/dL (ref 30.0–36.0)
MCV: 94.6 fL (ref 80.0–100.0)
Platelets: 178 10*3/uL (ref 150–400)
RBC: 2.98 MIL/uL — ABNORMAL LOW (ref 3.87–5.11)
RDW: 12.9 % (ref 11.5–15.5)
WBC: 9.7 10*3/uL (ref 4.0–10.5)
nRBC: 0 % (ref 0.0–0.2)

## 2019-09-06 SURGERY — Surgical Case
Anesthesia: Regional

## 2019-09-06 SURGERY — Surgical Case
Anesthesia: Spinal | Site: Abdomen | Wound class: Clean Contaminated

## 2019-09-06 MED ORDER — NALBUPHINE HCL 10 MG/ML IJ SOLN
5.0000 mg | Freq: Once | INTRAMUSCULAR | Status: DC | PRN
  Filled 2019-09-06: qty 0.5

## 2019-09-06 MED ORDER — FENTANYL CITRATE (PF) 100 MCG/2ML IJ SOLN: 25.0000 ug | INTRAMUSCULAR | Status: DC | PRN

## 2019-09-06 MED ORDER — SIMETHICONE 80 MG PO CHEW
80.0000 mg | CHEWABLE_TABLET | ORAL | Status: DC
  Administered 2019-09-06 – 2019-09-07 (×2): 80 mg via ORAL
  Filled 2019-09-06 (×2): qty 1

## 2019-09-06 MED ORDER — PHENYLEPHRINE HCL-NACL 20-0.9 MG/250ML-% IV SOLN
INTRAVENOUS | Status: AC
  Filled 2019-09-06: qty 250

## 2019-09-06 MED ORDER — KETOROLAC TROMETHAMINE 30 MG/ML IJ SOLN
INTRAMUSCULAR | Status: AC
  Filled 2019-09-06: qty 1

## 2019-09-06 MED ORDER — DEXTROSE 50 % IV SOLN
INTRAVENOUS | Status: AC
  Filled 2019-09-06: qty 50

## 2019-09-06 MED ORDER — CEFAZOLIN SODIUM-DEXTROSE 2-4 GM/100ML-% IV SOLN
INTRAVENOUS | Status: AC
  Filled 2019-09-06: qty 100

## 2019-09-06 MED ORDER — DIPHENHYDRAMINE HCL 50 MG/ML IJ SOLN: 12.5000 mg | INTRAMUSCULAR | Status: DC | PRN

## 2019-09-06 MED ORDER — SODIUM CHLORIDE 0.9 % IV SOLN
INTRAVENOUS | Status: DC | PRN
  Administered 2019-09-06: 17:00:00 via INTRAVENOUS

## 2019-09-06 MED ORDER — LACTATED RINGERS IV SOLN
INTRAVENOUS | Status: DC
  Administered 2019-09-07 (×2): via INTRAVENOUS

## 2019-09-06 MED ORDER — SCOPOLAMINE 1 MG/3DAYS TD PT72: 1.0000 | MEDICATED_PATCH | Freq: Once | TRANSDERMAL | Status: DC

## 2019-09-06 MED ORDER — PHENYLEPHRINE HCL-NACL 20-0.9 MG/250ML-% IV SOLN
INTRAVENOUS | Status: DC | PRN
  Administered 2019-09-06: 60 ug/min via INTRAVENOUS

## 2019-09-06 MED ORDER — CEFAZOLIN SODIUM-DEXTROSE 2-4 GM/100ML-% IV SOLN: 2.0000 g | INTRAVENOUS | Status: DC

## 2019-09-06 MED ORDER — COCONUT OIL OIL: 1.0000 "application " | TOPICAL_OIL | Status: DC | PRN

## 2019-09-06 MED ORDER — ONDANSETRON HCL 4 MG/2ML IJ SOLN
INTRAMUSCULAR | Status: DC | PRN
  Administered 2019-09-06: 4 mg via INTRAVENOUS

## 2019-09-06 MED ORDER — DIPHENHYDRAMINE HCL 25 MG PO CAPS: 25.0000 mg | ORAL_CAPSULE | ORAL | Status: DC | PRN

## 2019-09-06 MED ORDER — ONDANSETRON HCL 4 MG/2ML IJ SOLN
INTRAMUSCULAR | Status: AC
  Filled 2019-09-06: qty 2

## 2019-09-06 MED ORDER — NALBUPHINE HCL 10 MG/ML IJ SOLN
5.0000 mg | INTRAMUSCULAR | Status: DC | PRN
  Filled 2019-09-06: qty 0.5

## 2019-09-06 MED ORDER — TETANUS-DIPHTH-ACELL PERTUSSIS 5-2.5-18.5 LF-MCG/0.5 IM SUSP: 0.5000 mL | Freq: Once | INTRAMUSCULAR | Status: DC

## 2019-09-06 MED ORDER — KETOROLAC TROMETHAMINE 30 MG/ML IJ SOLN: 30.0000 mg | Freq: Four times a day (QID) | INTRAMUSCULAR | Status: AC | PRN

## 2019-09-06 MED ORDER — TRAMADOL HCL 50 MG PO TABS: 50.0000 mg | ORAL_TABLET | Freq: Four times a day (QID) | ORAL | Status: DC | PRN

## 2019-09-06 MED ORDER — KETOROLAC TROMETHAMINE 30 MG/ML IJ SOLN
30.0000 mg | Freq: Four times a day (QID) | INTRAMUSCULAR | Status: AC | PRN
  Administered 2019-09-06: 30 mg via INTRAVENOUS

## 2019-09-06 MED ORDER — SODIUM CHLORIDE 0.9% FLUSH: 3.0000 mL | INTRAVENOUS | Status: DC | PRN

## 2019-09-06 MED ORDER — FENTANYL CITRATE (PF) 100 MCG/2ML IJ SOLN
INTRAMUSCULAR | Status: DC | PRN
  Administered 2019-09-06: 15 ug via INTRATHECAL

## 2019-09-06 MED ORDER — LACTATED RINGERS IV SOLN
INTRAVENOUS | Status: DC | PRN
  Administered 2019-09-06 (×2): via INTRAVENOUS

## 2019-09-06 MED ORDER — NALOXONE HCL 4 MG/10ML IJ SOLN
1.0000 ug/kg/h | INTRAVENOUS | Status: DC | PRN
  Filled 2019-09-06: qty 5

## 2019-09-06 MED ORDER — ONDANSETRON HCL 4 MG/2ML IJ SOLN: 4.0000 mg | Freq: Three times a day (TID) | INTRAMUSCULAR | Status: DC | PRN

## 2019-09-06 MED ORDER — OXYTOCIN 40 UNITS IN NORMAL SALINE INFUSION - SIMPLE MED
INTRAVENOUS | Status: AC
  Filled 2019-09-06: qty 1000

## 2019-09-06 MED ORDER — OXYCODONE-ACETAMINOPHEN 5-325 MG PO TABS: 1.0000 | ORAL_TABLET | ORAL | Status: DC | PRN

## 2019-09-06 MED ORDER — WITCH HAZEL-GLYCERIN EX PADS: 1.0000 "application " | MEDICATED_PAD | CUTANEOUS | Status: DC | PRN

## 2019-09-06 MED ORDER — CEFAZOLIN SODIUM-DEXTROSE 2-3 GM-%(50ML) IV SOLR
INTRAVENOUS | Status: DC | PRN
  Administered 2019-09-06: 2 g via INTRAVENOUS

## 2019-09-06 MED ORDER — MORPHINE SULFATE (PF) 0.5 MG/ML IJ SOLN
INTRAMUSCULAR | Status: AC
  Filled 2019-09-06: qty 10

## 2019-09-06 MED ORDER — OXYTOCIN 40 UNITS IN NORMAL SALINE INFUSION - SIMPLE MED
INTRAVENOUS | Status: DC | PRN
  Administered 2019-09-06: 40 mL via INTRAVENOUS

## 2019-09-06 MED ORDER — ZOLPIDEM TARTRATE 5 MG PO TABS: 5.0000 mg | ORAL_TABLET | Freq: Every evening | ORAL | Status: DC | PRN

## 2019-09-06 MED ORDER — DEXTROSE 50 % IV SOLN
1.0000 | Freq: Once | INTRAVENOUS | Status: AC
  Administered 2019-09-06: 16:00:00 25 mL via INTRAVENOUS

## 2019-09-06 MED ORDER — FENTANYL CITRATE (PF) 100 MCG/2ML IJ SOLN
INTRAMUSCULAR | Status: AC
  Filled 2019-09-06: qty 2

## 2019-09-06 MED ORDER — NALOXONE HCL 0.4 MG/ML IJ SOLN: 0.4000 mg | INTRAMUSCULAR | Status: DC | PRN

## 2019-09-06 MED ORDER — BUPIVACAINE IN DEXTROSE 0.75-8.25 % IT SOLN
INTRATHECAL | Status: DC | PRN
  Administered 2019-09-06: 1.6 mL via INTRATHECAL

## 2019-09-06 MED ORDER — PRENATAL MULTIVITAMIN CH
1.0000 | ORAL_TABLET | Freq: Every day | ORAL | Status: DC
  Administered 2019-09-07: 1 via ORAL
  Filled 2019-09-06: qty 1

## 2019-09-06 MED ORDER — DIBUCAINE (PERIANAL) 1 % EX OINT: 1.0000 "application " | TOPICAL_OINTMENT | CUTANEOUS | Status: DC | PRN

## 2019-09-06 MED ORDER — SENNOSIDES-DOCUSATE SODIUM 8.6-50 MG PO TABS
2.0000 | ORAL_TABLET | ORAL | Status: DC
  Administered 2019-09-06 – 2019-09-07 (×2): 2 via ORAL
  Filled 2019-09-06 (×2): qty 2

## 2019-09-06 MED ORDER — MORPHINE SULFATE (PF) 0.5 MG/ML IJ SOLN
INTRAMUSCULAR | Status: DC | PRN
  Administered 2019-09-06: .15 ug via INTRATHECAL

## 2019-09-06 MED ORDER — ACETAMINOPHEN 10 MG/ML IV SOLN: 1000.0000 mg | Freq: Once | INTRAVENOUS | Status: DC | PRN

## 2019-09-06 MED ORDER — OXYTOCIN 40 UNITS IN NORMAL SALINE INFUSION - SIMPLE MED: 2.5000 [IU]/h | INTRAVENOUS | Status: AC

## 2019-09-06 MED ORDER — ACETAMINOPHEN 500 MG PO TABS
1000.0000 mg | ORAL_TABLET | Freq: Four times a day (QID) | ORAL | Status: AC
  Administered 2019-09-06 – 2019-09-07 (×4): 1000 mg via ORAL
  Filled 2019-09-06 (×4): qty 2

## 2019-09-06 MED ORDER — KETOROLAC TROMETHAMINE 30 MG/ML IJ SOLN: 30.0000 mg | Freq: Once | INTRAMUSCULAR | Status: DC | PRN

## 2019-09-06 MED ORDER — MEASLES, MUMPS & RUBELLA VAC IJ SOLR: 0.5000 mL | Freq: Once | INTRAMUSCULAR | Status: DC

## 2019-09-06 SURGICAL SUPPLY — 30 items
BENZOIN TINCTURE PRP APPL 2/3 (GAUZE/BANDAGES/DRESSINGS) ×2 IMPLANT
CHLORAPREP W/TINT 26ML (MISCELLANEOUS) ×3 IMPLANT
CLAMP CORD UMBIL (MISCELLANEOUS) IMPLANT
CLOSURE STERI-STRIP 1/2X4 (GAUZE/BANDAGES/DRESSINGS) ×1
CLOTH BEACON ORANGE TIMEOUT ST (SAFETY) ×3 IMPLANT
CLSR STERI-STRIP ANTIMIC 1/2X4 (GAUZE/BANDAGES/DRESSINGS) ×1 IMPLANT
DRSG OPSITE POSTOP 4X10 (GAUZE/BANDAGES/DRESSINGS) ×3 IMPLANT
ELECT REM PT RETURN 9FT ADLT (ELECTROSURGICAL) ×3
ELECTRODE REM PT RTRN 9FT ADLT (ELECTROSURGICAL) ×1 IMPLANT
EXTRACTOR VACUUM M CUP 4 TUBE (SUCTIONS) IMPLANT
EXTRACTOR VACUUM M CUP 4' TUBE (SUCTIONS)
GLOVE BIOGEL PI IND STRL 7.0 (GLOVE) ×1 IMPLANT
GLOVE BIOGEL PI INDICATOR 7.0 (GLOVE) ×2
GLOVE ECLIPSE 7.0 STRL STRAW (GLOVE) ×6 IMPLANT
GOWN STRL REUS W/TWL LRG LVL3 (GOWN DISPOSABLE) ×6 IMPLANT
KIT ABG SYR 3ML LUER SLIP (SYRINGE) IMPLANT
NDL HYPO 25X5/8 SAFETYGLIDE (NEEDLE) IMPLANT
NEEDLE HYPO 25X5/8 SAFETYGLIDE (NEEDLE) IMPLANT
NS IRRIG 1000ML POUR BTL (IV SOLUTION) ×3 IMPLANT
PACK C SECTION WH (CUSTOM PROCEDURE TRAY) ×3 IMPLANT
PAD OB MATERNITY 4.3X12.25 (PERSONAL CARE ITEMS) ×3 IMPLANT
SUT MNCRL 0 VIOLET CTX 36 (SUTURE) ×3 IMPLANT
SUT MON AB 2-0 CT1 27 (SUTURE) ×6 IMPLANT
SUT MONOCRYL 0 CTX 36 (SUTURE) ×6
SUT PLAIN 0 NONE (SUTURE) IMPLANT
SUT PLAIN 2 0 (SUTURE) ×2
SUT PLAIN ABS 2-0 CT1 27XMFL (SUTURE) IMPLANT
TOWEL OR 17X24 6PK STRL BLUE (TOWEL DISPOSABLE) ×3 IMPLANT
TRAY FOLEY W/BAG SLVR 14FR LF (SET/KITS/TRAYS/PACK) IMPLANT
WATER STERILE IRR 1000ML POUR (IV SOLUTION) ×3 IMPLANT

## 2019-09-06 NOTE — Transfer of Care (Signed)
Immediate Anesthesia Transfer of Care Note  Patient: Anna Nelson  Procedure(s) Performed: CESAREAN SECTION (N/A Abdomen)  Patient Location: PACU  Anesthesia Type:Spinal  Level of Consciousness: awake, alert  and oriented  Airway & Oxygen Therapy: Patient Spontanous Breathing  Post-op Assessment: Report given to RN and Post -op Vital signs reviewed and stable  Post vital signs: Reviewed and stable  Last Vitals:  Vitals Value Taken Time  BP    Temp    Pulse 71 09/06/19 1805  Resp 16 09/06/19 1805  SpO2 99 % 09/06/19 1805  Vitals shown include unvalidated device data.  Last Pain: There were no vitals filed for this visit.       Complications: No apparent anesthesia complications

## 2019-09-06 NOTE — H&P (Signed)
Anna Nelson is an 33 y.o. G2P0010 [redacted]w[redacted]d white female who Is admitted for a primary  Section for Breech. Her pregnancy has been complicated by GDM O9/ GHTN/Oligo. She was recently admitted to ante and observed for Oligo. GBS- pos, NIPT wnl.   Past Medical History:  Diagnosis Date  . Gestational diabetes   . Hypertension     Past Surgical History:  Procedure Laterality Date  . WISDOM TOOTH EXTRACTION      Family History  Problem Relation Age of Onset  . Diabetes Father   . Lymphoma Maternal Grandmother    Social History:  reports that she has never smoked. She has never used smokeless tobacco. She reports that she does not drink alcohol or use drugs.  Allergies: No Known Allergies  Medications Prior to Admission  Medication Sig Dispense Refill  . aspirin EC 81 MG tablet Take 81 mg by mouth daily.    . ferrous sulfate 325 (65 FE) MG tablet Take 325 mg by mouth daily with breakfast.    . labetalol (NORMODYNE) 200 MG tablet Take 200 mg by mouth 2 (two) times daily.    . Prenatal Vit-Fe Fumarate-FA (PRENATAL MULTIVITAMIN) TABS tablet Take 1 tablet by mouth daily at 12 noon.         Blood pressure (!) 152/93, height 5\' 1"  (1.549 m), weight 73.9 kg. General appearance: alert and cooperative Abdomen: gravid, non tender   Lab Results  Component Value Date   WBC 10.2 09/04/2019   HGB 10.7 (L) 09/04/2019   HCT 31.9 (L) 09/04/2019   MCV 94.9 09/04/2019   PLT 213 09/04/2019   No results found for: PREGTESTUR, PREGSERUM, HCG, HCGQUANT    Patient Active Problem List   Diagnosis Date Noted  . Gestational diabetes mellitus 09/01/2019  . Hypertension, essential 09/01/2019  . Oligohydramnios antepartum, third trimester, fetus 1 08/28/2019   IMP/ IUP at 37 weeks with GDM/GHTN/+ GBS/ Breech Plan/ Proceed with c/s  ANDERSON,MARK E 09/06/2019, 4:41 PM

## 2019-09-06 NOTE — Anesthesia Procedure Notes (Signed)
Spinal  Patient location during procedure: OR Start time: 09/06/2019 5:00 PM End time: 09/06/2019 5:10 PM Staffing Anesthesiologist: Freddrick March, MD Performed: anesthesiologist  Preanesthetic Checklist Completed: patient identified, surgical consent, pre-op evaluation, timeout performed, IV checked, risks and benefits discussed and monitors and equipment checked Spinal Block Patient position: sitting Prep: site prepped and draped and DuraPrep Patient monitoring: cardiac monitor, continuous pulse ox and blood pressure Approach: midline Location: L3-4 Injection technique: single-shot Needle Needle type: Pencan  Needle gauge: 24 G Needle length: 9 cm Assessment Sensory level: T6 Additional Notes Functioning IV was confirmed and monitors were applied. Sterile prep and drape, including hand hygiene and sterile gloves were used. The patient was positioned and the spine was prepped. The skin was anesthetized with lidocaine.  Free flow of clear CSF was obtained prior to injecting local anesthetic into the CSF.  The spinal needle aspirated freely following injection.  The needle was carefully withdrawn.  The patient tolerated the procedure well.

## 2019-09-06 NOTE — Anesthesia Postprocedure Evaluation (Signed)
Anesthesia Post Note  Patient: Anna Nelson  Procedure(s) Performed: CESAREAN SECTION (N/A Abdomen)     Patient location during evaluation: PACU Anesthesia Type: Spinal Level of consciousness: oriented and awake and alert Pain management: pain level controlled Vital Signs Assessment: post-procedure vital signs reviewed and stable Respiratory status: spontaneous breathing, respiratory function stable and patient connected to nasal cannula oxygen Cardiovascular status: blood pressure returned to baseline and stable Postop Assessment: no headache, no backache, no apparent nausea or vomiting and spinal receding Anesthetic complications: no Comments: Blood glucose in PACU 63, patient asymptomatic. Taking PO well.    Last Vitals:  Vitals:   09/06/19 1915 09/06/19 1942  BP: (!) 151/89 138/87  Pulse: 65   Resp: 17   Temp: 36.8 C   SpO2: 98%     Last Pain:  Vitals:   09/06/19 1942  PainSc: 3    Pain Goal:                Epidural/Spinal Function Cutaneous sensation: Able to Wiggle Toes (09/06/19 1900), Patient able to flex knees: Yes (09/06/19 1900), Patient able to lift hips off bed: Yes (09/06/19 1900), Back pain beyond tenderness at insertion site: No (09/06/19 1900), Progressively worsening motor and/or sensory loss: No (09/06/19 1900), Bowel and/or bladder incontinence post epidural: No (09/06/19 1900)  Pervis Hocking

## 2019-09-06 NOTE — Anesthesia Preprocedure Evaluation (Addendum)
Anesthesia Evaluation  Patient identified by MRN, date of birth, ID band Patient awake    Reviewed: Allergy & Precautions, NPO status , Patient's Chart, lab work & pertinent test results, reviewed documented beta blocker date and time   Airway Mallampati: II  TM Distance: >3 FB Neck ROM: Full    Dental no notable dental hx.    Pulmonary neg pulmonary ROS,    Pulmonary exam normal breath sounds clear to auscultation       Cardiovascular hypertension, Pt. on medications and Pt. on home beta blockers negative cardio ROS Normal cardiovascular exam Rhythm:Regular Rate:Normal     Neuro/Psych negative neurological ROS  negative psych ROS   GI/Hepatic negative GI ROS, Neg liver ROS,   Endo/Other  negative endocrine ROSdiabetes, Gestational  Renal/GU negative Renal ROS  negative genitourinary   Musculoskeletal negative musculoskeletal ROS (+)   Abdominal   Peds  Hematology negative hematology ROS (+)   Anesthesia Other Findings   Reproductive/Obstetrics (+) Pregnancy                            Anesthesia Physical Anesthesia Plan  ASA: III  Anesthesia Plan: Spinal   Post-op Pain Management:    Induction:   PONV Risk Score and Plan: Treatment may vary due to age or medical condition  Airway Management Planned: Natural Airway  Additional Equipment:   Intra-op Plan:   Post-operative Plan:   Informed Consent: I have reviewed the patients History and Physical, chart, labs and discussed the procedure including the risks, benefits and alternatives for the proposed anesthesia with the patient or authorized representative who has indicated his/her understanding and acceptance.     Dental advisory given  Plan Discussed with: CRNA  Anesthesia Plan Comments:         Anesthesia Quick Evaluation

## 2019-09-07 ENCOUNTER — Encounter (HOSPITAL_COMMUNITY): Payer: Self-pay | Admitting: *Deleted

## 2019-09-07 LAB — BIRTH TISSUE RECOVERY COLLECTION (PLACENTA DONATION)

## 2019-09-07 LAB — CBC
HCT: 26.4 % — ABNORMAL LOW (ref 36.0–46.0)
Hemoglobin: 9 g/dL — ABNORMAL LOW (ref 12.0–15.0)
MCH: 32.6 pg (ref 26.0–34.0)
MCHC: 34.1 g/dL (ref 30.0–36.0)
MCV: 95.7 fL (ref 80.0–100.0)
Platelets: 171 10*3/uL (ref 150–400)
RBC: 2.76 MIL/uL — ABNORMAL LOW (ref 3.87–5.11)
RDW: 13.2 % (ref 11.5–15.5)
WBC: 9.1 10*3/uL (ref 4.0–10.5)
nRBC: 0 % (ref 0.0–0.2)

## 2019-09-07 LAB — GLUCOSE, CAPILLARY: Glucose-Capillary: 63 mg/dL — ABNORMAL LOW (ref 70–99)

## 2019-09-07 MED ORDER — IBUPROFEN 800 MG PO TABS
800.0000 mg | ORAL_TABLET | Freq: Three times a day (TID) | ORAL | Status: DC
  Administered 2019-09-07 – 2019-09-08 (×3): 800 mg via ORAL
  Filled 2019-09-07 (×3): qty 1

## 2019-09-07 MED ORDER — LABETALOL HCL 200 MG PO TABS
200.0000 mg | ORAL_TABLET | Freq: Two times a day (BID) | ORAL | Status: DC
  Administered 2019-09-07 – 2019-09-08 (×3): 200 mg via ORAL
  Filled 2019-09-07 (×3): qty 1

## 2019-09-07 MED ORDER — LACTATED RINGERS IV BOLUS: 500.0000 mL | Freq: Once | INTRAVENOUS | Status: DC

## 2019-09-07 NOTE — Progress Notes (Signed)
Subjective: Postpartum Day 1: Cesarean Delivery Patient reports tolerating PO. No issues with pain.    Objective: Vital signs in last 24 hours: Temp:  [97.7 F (36.5 C)-98.6 F (37 C)] 98.4 F (36.9 C) (10/22 0520) Pulse Rate:  [59-82] 75 (10/22 0520) Resp:  [10-24] 18 (10/22 0520) BP: (124-152)/(69-93) 125/93 (10/22 0520) SpO2:  [97 %-100 %] 98 % (10/21 2100) Weight:  [73.9 kg] 73.9 kg (10/21 1616)  Physical Exam:  General: alert and no distress Lochia: appropriate Uterine Fundus: firm Incision: dressing clean and dry DVT Evaluation: No evidence of DVT seen on physical exam.  Recent Labs    09/06/19 1920 09/07/19 0540  HGB 9.8* 9.0*  HCT 28.2* 26.4*    Assessment/Plan: Status post Cesarean section. Doing well postoperatively.  Continue current care Anna Nelson 33 y.o. G2P0010 at [redacted]w[redacted]d POD#1 sp CS 2/2 breech 1. POC: Hgb 9.8>9, remove foley this AM 2. CHTN: BP elevated this am, ordered home meds 3. GDMA1: f/u pp Rubella immune, blood type A+, breastfeeding, baby boy, desires circ  Anna Nelson K Taam-Akelman 09/07/2019, 10:01 AM

## 2019-09-07 NOTE — Lactation Note (Signed)
This note was copied from a baby's chart. Lactation Consultation Note  Patient Name: Anna Nelson [id number]'P Date: 09/07/2019 Reason for consult: Initial assessment;Primapara;1st time breastfeeding;Early term 37-38.6wks;Infant weight loss;Maternal endocrine disorder Type of Endocrine Disorder?: Diabetes  13 hours old ETI who is being exclusively BF by his mother, she's a P1. Baby has been having difficulties staying awake for feedings and keeping up with his temperatures, mom doing STS with baby when entering the room. Mom has Hx of GDM but baby's serum glucose were 43 and 70 and WNL. He's been very spitty since birth, this is a C/S baby. Mom reported (+) breast changes during the pregnancy, she has a Spectra DEBP at home.  DEBP kit has been brought to the room last night but not set up. [place] set it up, reviewed instructions, cleaning and storage as well as milk storage guidelines. [place] worked on some hand expression prior latching, mom able to get a small needle size droplets of thick white/yellowish colostrum. Encouraged her to finger feed baby on each feeding prior latching.   Offered assistance with latch and mom agreed to wake baby up to feed. LC took baby on cross cradle hold to the right breast, but he was very sleepy, he did latch but required constant stimulation to keep him awake. Due to low temperatures LC had to cover baby while feeding, which may have speed up the falling asleep process, no audible swallows noted during this feeding.   Reviewed normal newborn behavior, LPI policy (due to the fact that baby might be dropping < 6 lbs tomorrow), supplementation guidelines when feeding baby at the breast, feeding cues and cluster feeding.  Feeding plan:  1. Encouraged mom to feed baby STS 8-12 times/24 hours or sooner if feeding cues are present 2. She'll start pumping today, every 3 hours after feedings; and will offer any drops/amount of EBM she may get 3. If mom unable to pump volumes  required for supplementation, baby will start further supplementation with Similac 22 calorie formula, due to birth weight and G.A  BF brochure, BF resources, feeding diary and LPI handout were reviewed. Parents agreeable with feeding plan; they reported all questions and concerns were answered, they're both aware of [place] OP services and will call PRN.   Maternal Data Formula Feeding for Exclusion: No Has patient been taught Hand Expression?: Yes Does the patient have breastfeeding experience prior to this delivery?: No  Feeding Feeding Type: Breast Fed  LATCH Score Latch: Repeated attempts needed to sustain latch, nipple held in mouth throughout feeding, stimulation needed to elicit sucking reflex.  Audible Swallowing: None  Type of Nipple: Everted at rest and after stimulation  Comfort (Breast/Nipple): Soft / non-tender  Hold (Positioning): Assistance needed to correctly position infant at breast and maintain latch.  LATCH Score: 6  Interventions Interventions: Breast feeding basics reviewed;Assisted with latch;Skin to skin;Breast massage;Hand express;Breast compression;DEBP;Adjust position;Support pillows  Lactation Tools Discussed/Used Tools: Pump Breast pump type: Double-Electric Breast Pump WIC Program: No Pump Review: Setup, frequency, and cleaning;Milk Storage Initiated by:: MPeck Date initiated:: 09/07/19   Consult Status Consult Status: Follow-up Date: 09/08/19 Follow-up type: In-patient    Anna Nelson 09/07/2019, 12:52 PM

## 2019-09-07 NOTE — Op Note (Signed)
Anna Nelson, Anna Nelson MEDICAL RECORD [id number] ACCOUNT [id number] DATE OF BIRTH:1985-12-21 FACILITY: [facility] LOCATION: [place], [place]  OPERATIVE REPORT  DATE OF PROCEDURE:  09/06/2019  PREOPERATIVE DIAGNOSES:   1.  Intrauterine pregnancy at 44 weeks estimated gestational age. 2.  Gestational hypertension. 3.  Gestational diabetes. 4.  Oligohydramnios. 5.  Breech presentation.  POSTOPERATIVE DIAGNOSES: 1.  Intrauterine pregnancy at 60 weeks estimated gestational age. 2.  Gestational hypertension. 3.  Gestational diabetes. 4.  Oligohydramnios. 5.  Breech presentation.  PROCEDURE PERFORMED:  Primary low transverse cesarean section.  SURGEON:  Freda Munro, MD  ANESTHESIA:  Spinal.  ANTIBIOTICS:  Ancef 2 g.  DRAINS:  Foley, bedside drainage.  ESTIMATED BLOOD LOSS:  900 mL.  SPECIMENS:  None.  FINDINGS:  The patient had normal fallopian tubes and ovaries bilaterally.  The uterus was normal in shape.  There were no abnormalities on the cavity of the uterus.  DESCRIPTION OF PROCEDURE:  The patient was taken to the operating room where a spinal anesthetic was administered without difficulty.  She was then placed in the dorsal supine position with a left lateral tilt.  She was prepped and draped in the usual  fashion for this procedure.  A Foley catheter was placed.  A Pfannenstiel incision was made 2 cm above the pubic symphysis.  On entering the abdominal cavity, the bladder flap was taken down with sharp dissection.  A low transverse uterine incision was  made in the midline and extended laterally with blunt dissection.  Amniotic fluid was noted to be clear.  The infant was delivered in the frank breech presentation.  On delivery of the head, the oropharynx and nostrils were bulb suctioned.  The cord was  doubly clamped and cut, and the infant handed to the awaiting NICU team.  Cord blood was then obtained.  The placenta was then manually removed.  The  uterus was exteriorized.  The uterine cavity was wiped with a wet lap and examined.  The uterine  incision was closed in a single layer of 0 Monocryl suture in a running locking fashion.  The bladder flap was closed using 2-0 Monocryl suture in a running fashion.  The uterus was placed back in the abdominal cavity.  Hemostasis was checked and found  to be adequate.  The parietal peritoneum and rectus muscles were reapproximated in the midline using 2-0 Monocryl in a running fashion.  The fascia was closed using 0 Monocryl suture in a running fashion.  Subcuticular tissue was made hemostatic with the  Bovie and irrigated.  The skin incision was then closed using 3-0 Vicryl in a subcuticular fashion.  Steri-Strips were applied.  The patient was taken to recovery room in stable condition.  Instrument and lap count was correct x3.  LN/NUANCE  D:09/06/2019 T:09/07/2019 JOB:[id number]

## 2019-09-08 MED ORDER — IBUPROFEN 600 MG PO TABS: 600.0000 mg | ORAL_TABLET | Freq: Four times a day (QID) | ORAL | 0 refills | Status: DC | PRN

## 2019-09-08 MED ORDER — OXYCODONE HCL 5 MG PO TABS: 5.0000 mg | ORAL_TABLET | Freq: Four times a day (QID) | ORAL | 0 refills | Status: DC | PRN

## 2019-09-08 NOTE — Discharge Summary (Signed)
Obstetric Discharge Summary Reason for Admission: cesarean section and breech / oligohydramnios / chtn Prenatal Procedures: none Intrapartum Procedures: cesarean: low cervical, transverse Postpartum Procedures: none Complications-Operative and Postpartum: none Hemoglobin  Date Value Ref Range Status  09/07/2019 9.0 (L) 12.0 - 15.0 g/dL Final   HCT  Date Value Ref Range Status  09/07/2019 26.4 (L) 36.0 - 46.0 % Final    Physical Exam:  General: alert, cooperative and appears stated age 33: appropriate Uterine Fundus: firm Incision: healing well, no significant drainage DVT Evaluation: No evidence of DVT seen on physical exam.  Discharge Diagnoses: Term Pregnancy-delivered  Discharge Information: Date: 09/08/2019 Activity: pelvic rest Diet: routine Medications: PNV, Ibuprofen and oxycodone, labetalol Condition: stable Instructions: refer to practice specific booklet Discharge to: home   Newborn Data: Live born female  Birth Weight: 6 lb 1.9 oz (2775 g) APGAR: 9, 9  Newborn Delivery   Birth date/time: 09/06/2019 17:25:00 Delivery type: C-Section, Low Transverse Trial of labor: No C-section categorization: Primary      Home with mother.  Brown 09/08/2019, 10:52 AM

## 2019-09-08 NOTE — Progress Notes (Signed)
Patient is doing well.  She is tolerating PO, ambulating, voiding.  Pain is controlled.  Lochia is appropriate  Vitals:   09/07/19 1347 09/07/19 1830 09/07/19 2119 09/08/19 0641  BP: 115/75 (!) 143/92 125/71 (!) 136/94  Pulse: 63 68 67 (!) 58  Resp: 20 18 14 16   Temp: 98.2 F (36.8 C) 98.6 F (37 C) 98.2 F (36.8 C) 98.4 F (36.9 C)  TempSrc:  Oral Oral Oral  SpO2: 98% 99% 100% 98%  Weight:      Height:        NAD Abdomen:  soft, appropriate tenderness, incisions intact and without erythema or drainage ext:    Symmetric, 1+ edema bilaterally  Lab Results  Component Value Date   WBC 9.1 09/07/2019   HGB 9.0 (L) 09/07/2019   HCT 26.4 (L) 09/07/2019   MCV 95.7 09/07/2019   PLT 171 09/07/2019    --/--/A POS (10/19 1647)/RImmune  A/P    33 y.o. G2P1011 POD 2 s/p primary c/s for breech Routine post op and postpartum care.   CHTN--continues on home labetalol 200mg  bid.  BPs mild range GDMA1--PP f/u Meeting all goals, will discharge to home today   Desires circumcision. Discussed r/b/a of the procedure. Reviewed that circumcision is an elective surgical procedure and not considered medically necessary. Reviewed the risks of the procedure including the risk of infection, bleeding, damage to surrounding structures, including scrotum, shaft, urethra and head of penis, and an undesired cosmetic effect requiring additional procedures for revision. Consent signed.

## 2021-03-22 IMAGING — US US MFM OB LIMITED
1 series · 15 of 20 positions shown · non-contrast
Comparison: none

[Series 1: us mfm ob limited · 20 acquisitions, 15 frames shown]
[im 1/20]
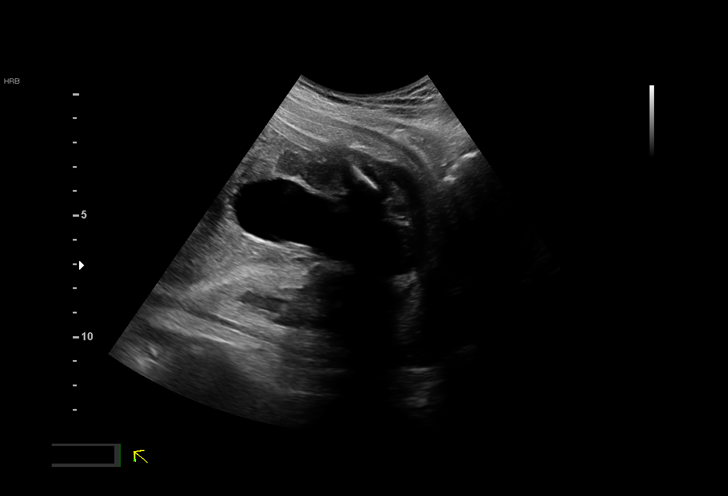
[im 3/20]
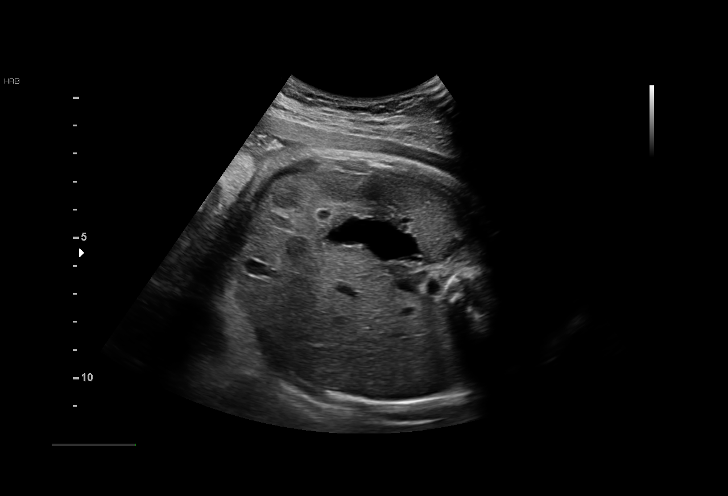
[im 4/20]
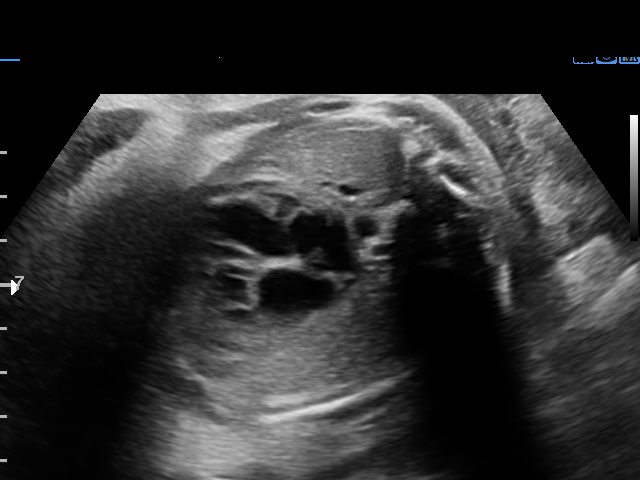
[im 5/20]
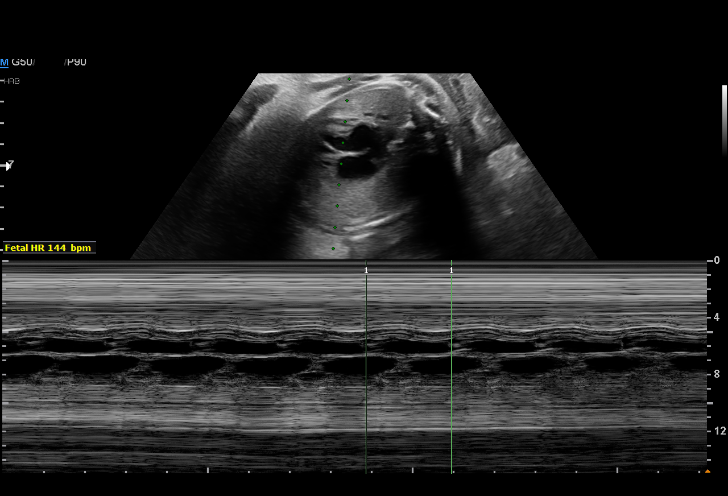
[im 7/20]
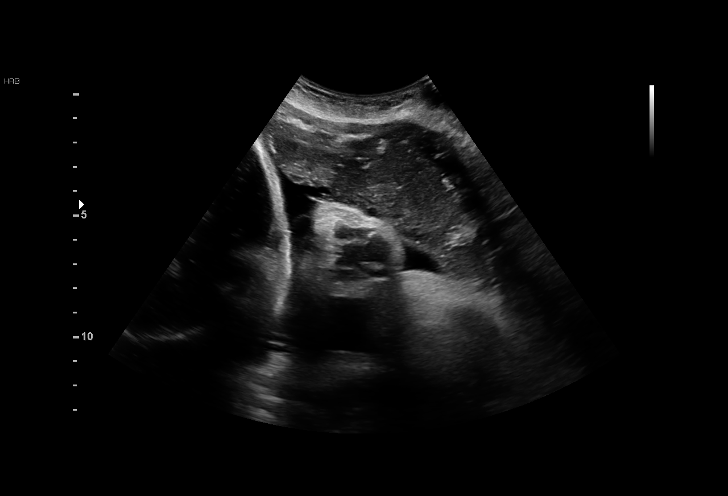
[im 8/20]
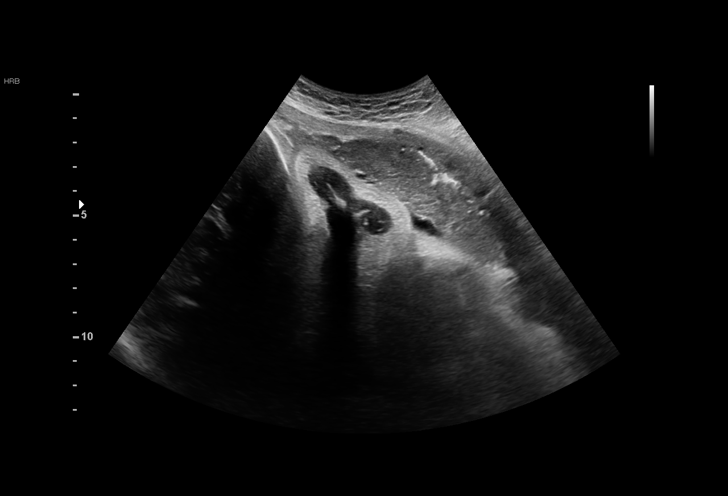
[im 9/20]
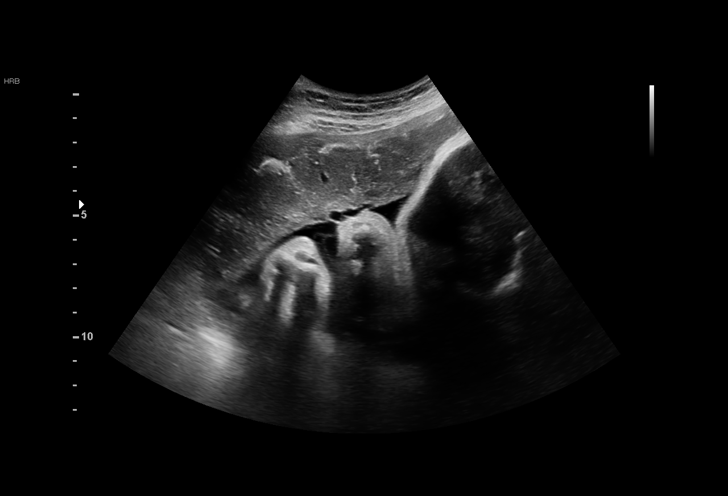
[im 11/20]
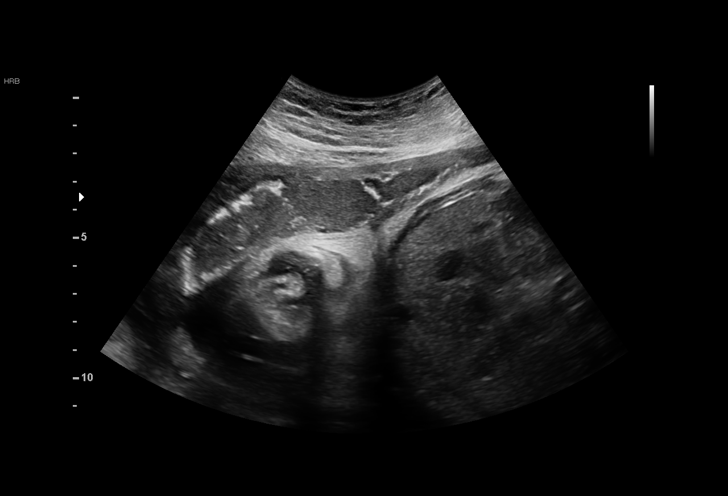
[im 12/20]
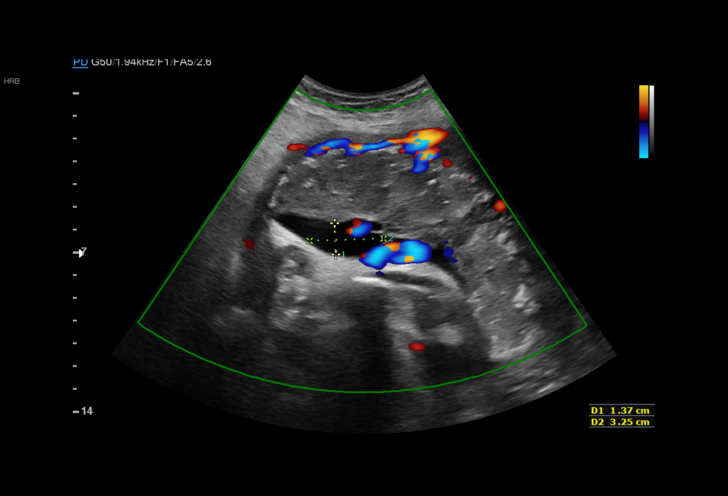
[im 13/20]
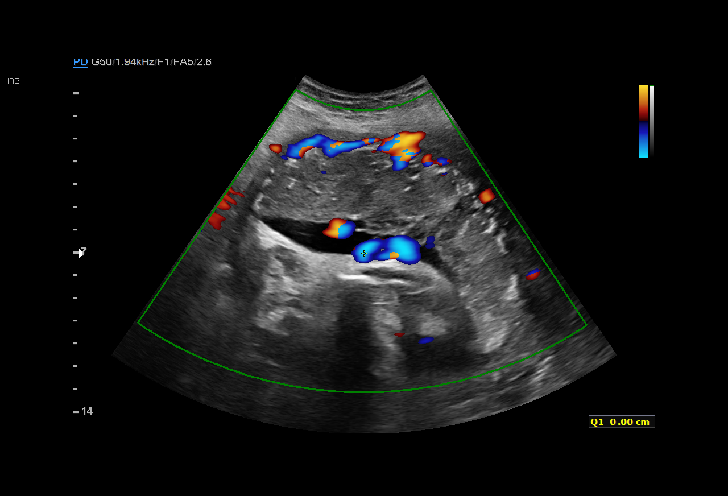
[im 15/20]
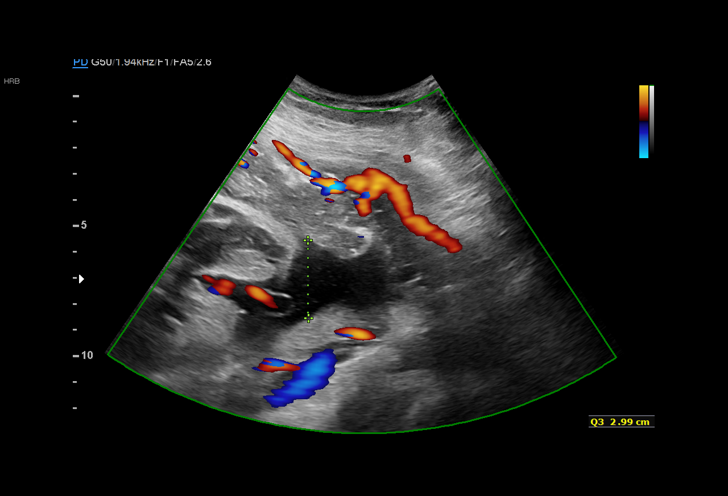
[im 16/20]
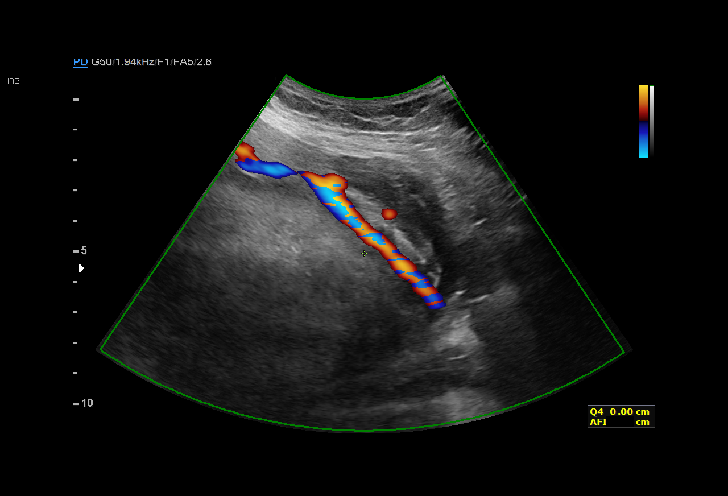
[im 17/20]
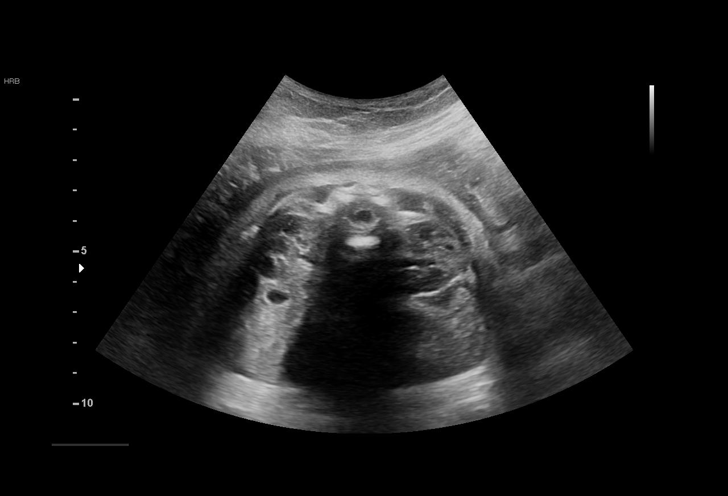
[im 19/20]
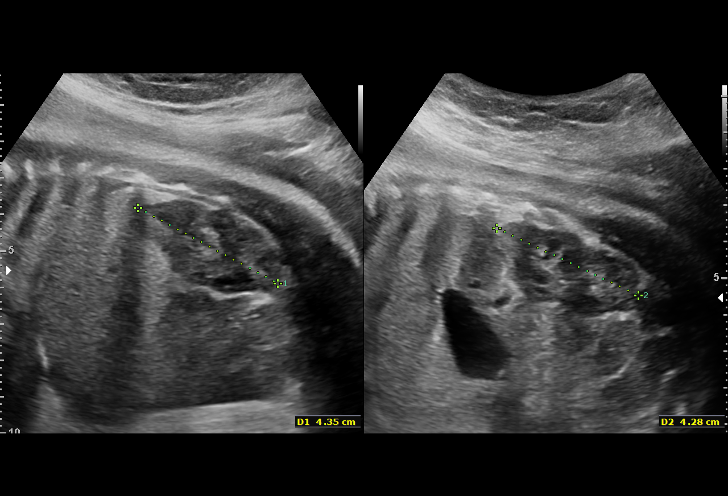
[im 20/20]
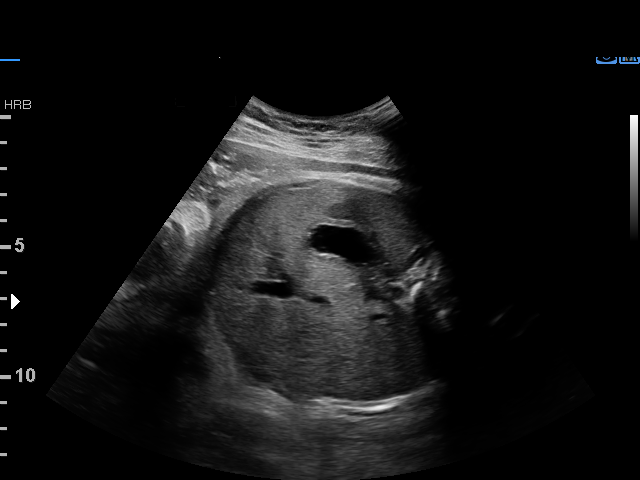

[15 of 20 positions shown; findings below may reference images not displayed]

Road; [HOSPITAL]

  1  US MFM OB LIMITED                    76815.01     JAYLON AUJLA
 ----------------------------------------------------------------------

 ----------------------------------------------------------------------
Indications

  Decreased amniotic fluid volume
  Gestational diabetes in pregnancy, diet
  controlled
  Hypertension - Chronic/Pre-existing
  (labetalol)
  36 weeks gestation of pregnancy
 ----------------------------------------------------------------------
Vital Signs

 BMI:
Fetal Evaluation

 Num Of Fetuses:         1
 Fetal Heart Rate(bpm):  144
 Cardiac Activity:       Observed
 Presentation:           Breech
 Placenta:               Anterior

 Amniotic Fluid
 AFI FV:      Subjectively decreased

 AFI Sum(cm)     %Tile       Largest Pocket(cm)
 4.79            < 3

 RUQ(cm)       RLQ(cm)       LUQ(cm)        LLQ(cm)
 1.8           0             0
OB History

 Gravidity:    2          SAB:   1
 Living:       0
Gestational Age

 Best:          36w 2d     Det. By:  Previous Ultrasound      EDD:   09/26/19
Anatomy

 Thoracic:              Appears normal         Kidneys:                Appear normal
 Stomach:               Appears normal, left   Bladder:                Appears normal
                        sided
Impression

 Patient with oligohydramnios returned for amniotic fluid
 evaluation.
 On ultrasound, the AFI is 5 cm.  Single deepest vertical
 pocket is 3 cm.  Good fetal activity seen.  Breech
 presentation.
 I reviewed the NST, which is reactive.

 Although she has oligohydramnios based on amniotic fluid
 index, single deepest vertical pocket of more than 2 cm is
 associated with good outcomes. Outpatient management is
 reasonable and delivery can be considered at 37 weeks.
                 Dass Svr, Ratgai

## 2021-03-23 IMAGING — US US MFM FETAL BPP W/O NON-STRESS
1 series · 15 of 26 positions shown · non-contrast
Comparison: none

[Series 1: us mfm fetal bpp w/o non-stress · 26 acquisitions, 15 frames shown]
[im 1/26]
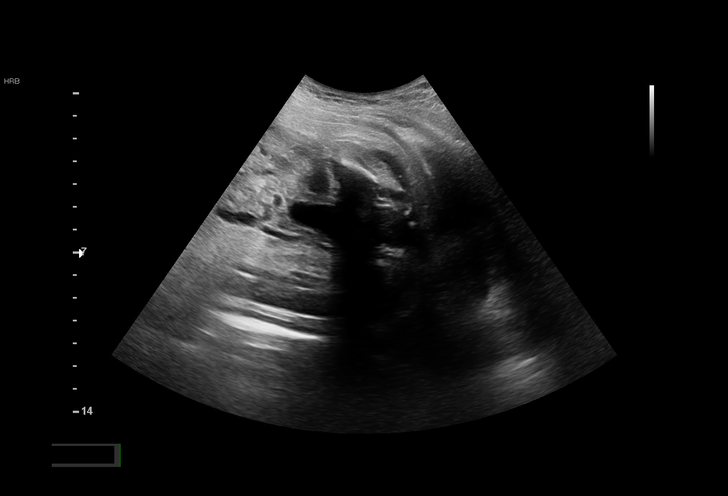
[im 3/26]
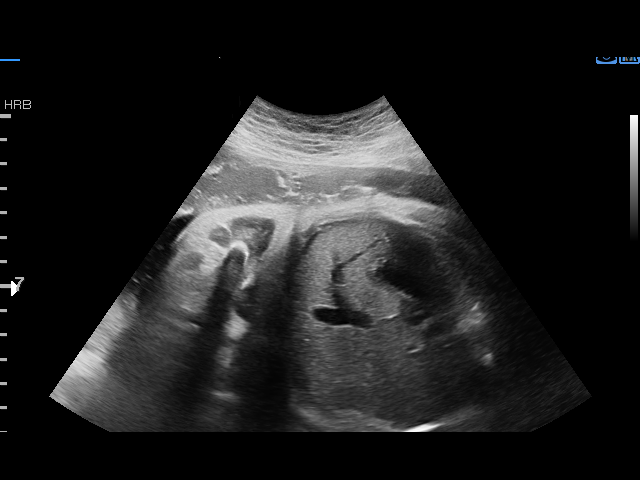
[im 5/26]
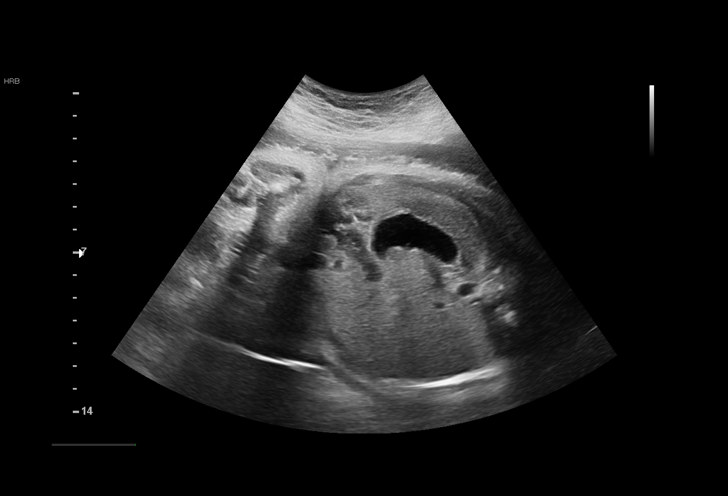
[im 7/26]
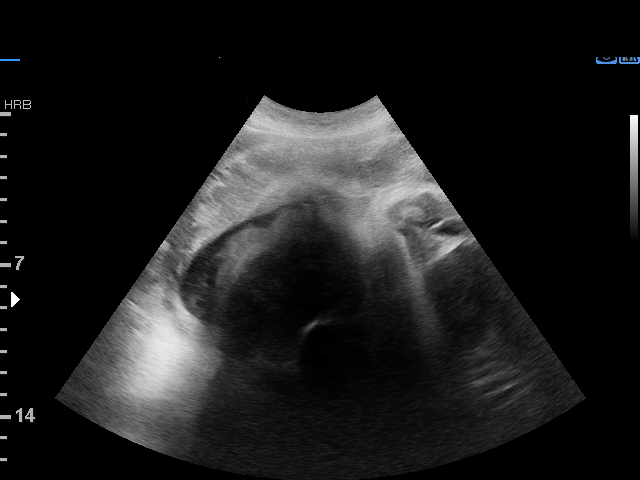
[im 8/26]
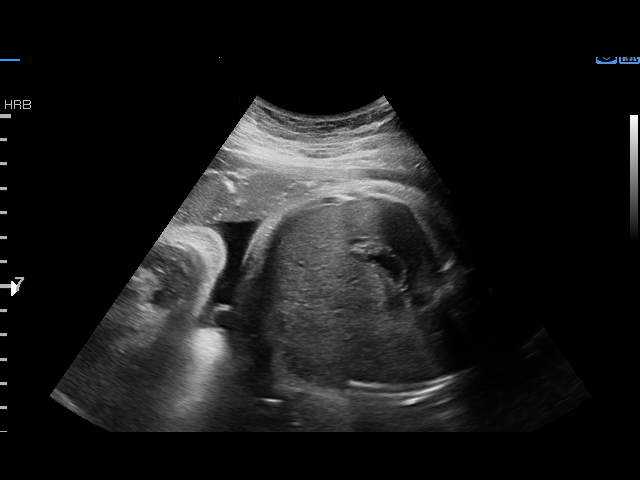
[im 10/26]
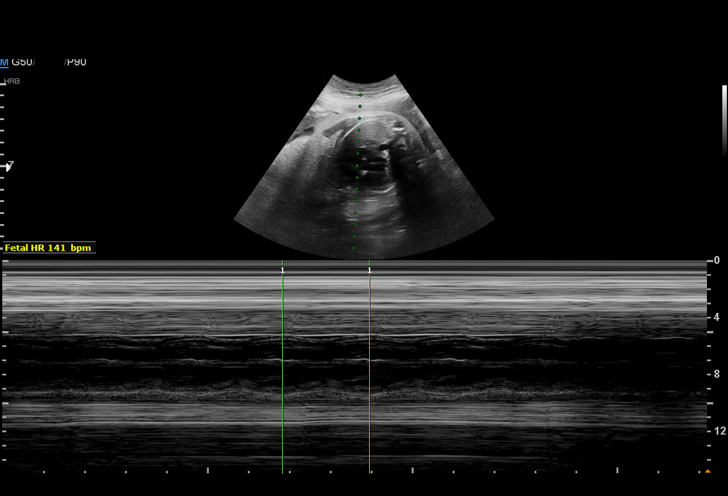
[im 12/26]
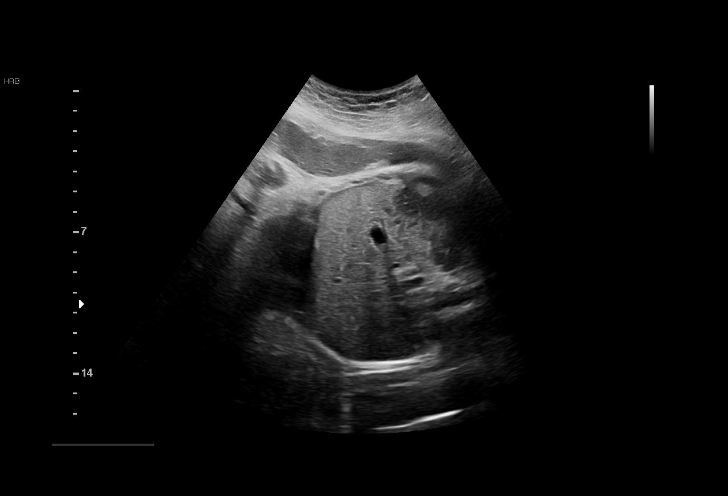
[im 14/26]
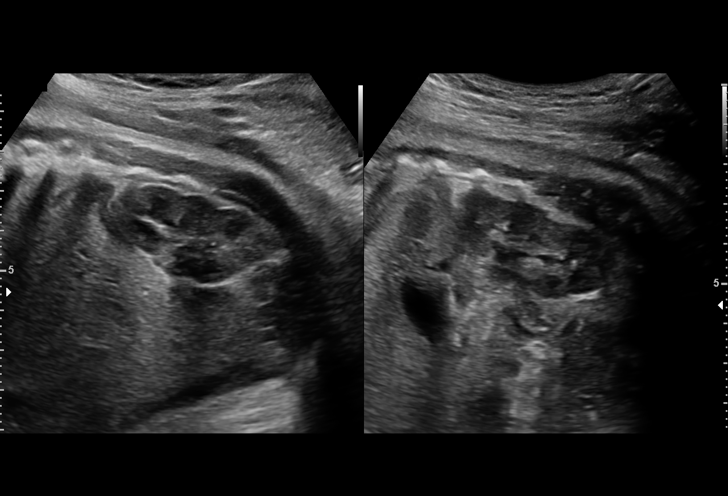
[im 15/26]
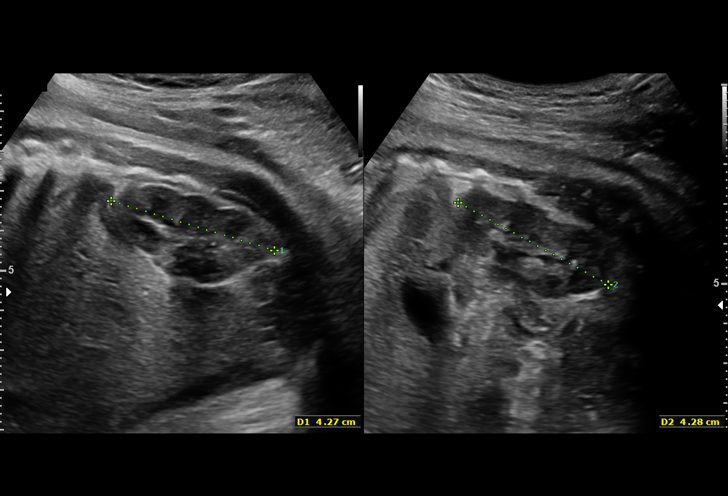
[im 17/26]
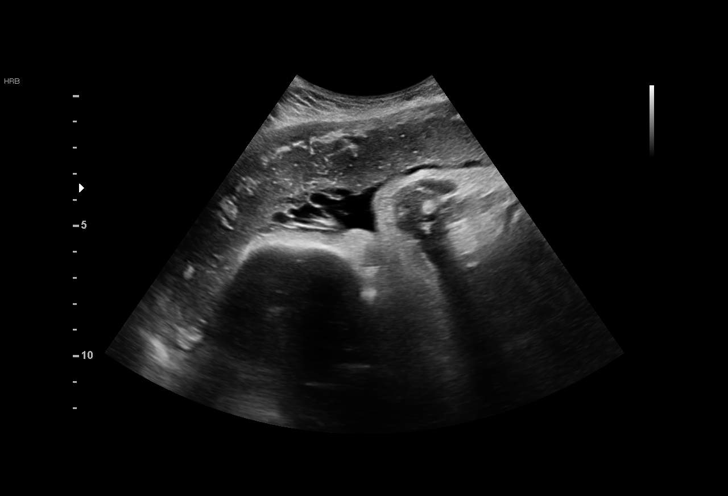
[im 19/26]
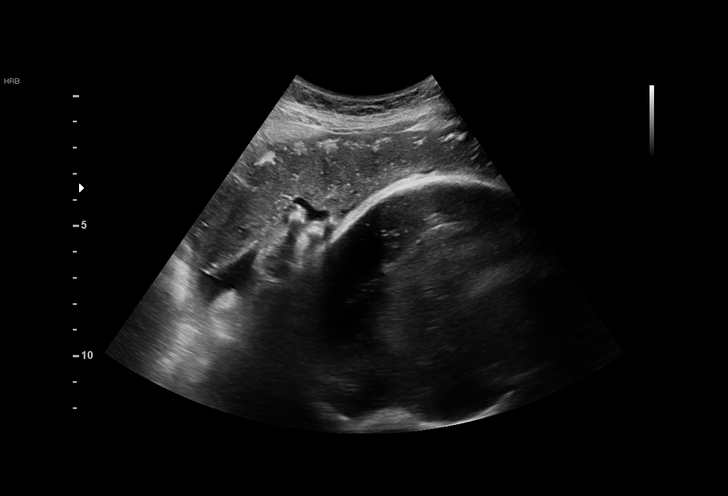
[im 20/26]
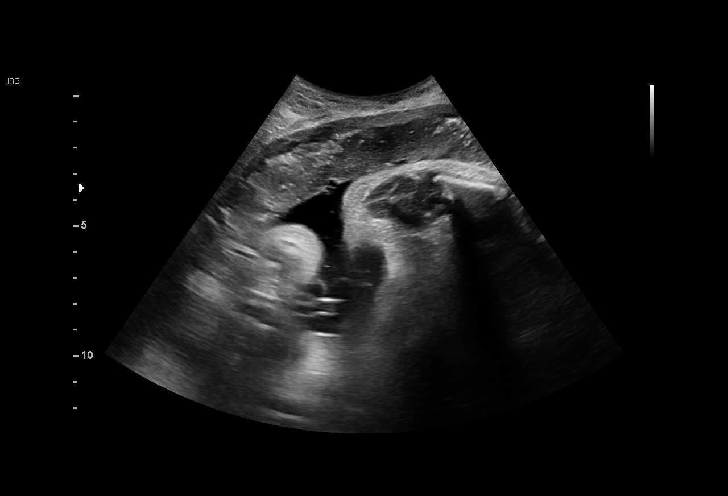
[im 22/26]
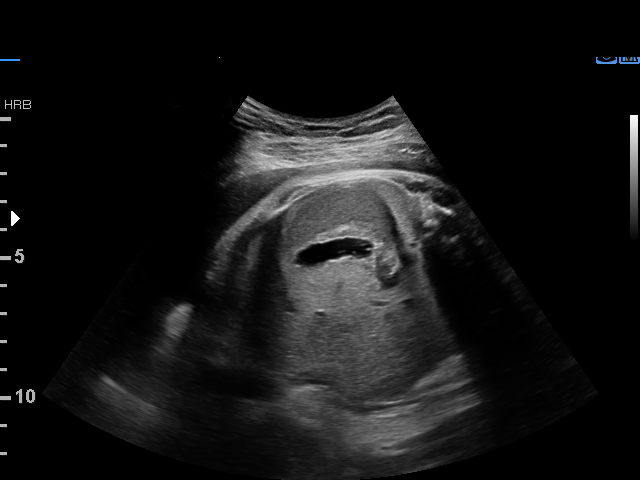
[im 24/26]
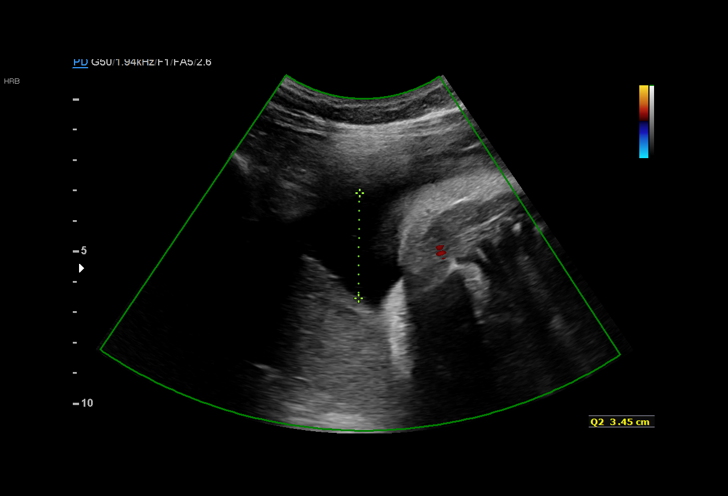
[im 26/26]
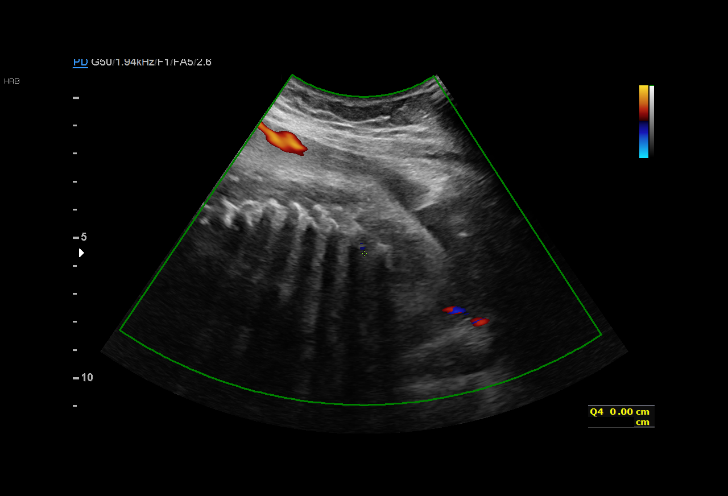

[15 of 26 positions shown; findings below may reference images not displayed]

Road; [HOSPITAL]

 ----------------------------------------------------------------------

 ----------------------------------------------------------------------
Indications

  Decreased amniotic fluid volume
  Gestational diabetes in pregnancy, diet
  controlled
  Hypertension - Chronic/Pre-existing
  (labetalol)
  36 weeks gestation of pregnancy
 ----------------------------------------------------------------------
Vital Signs

 BMI:
Fetal Evaluation

 Num Of Fetuses:         1
 Fetal Heart Rate(bpm):  141
 Cardiac Activity:       Observed
 Presentation:           Breech
 Placenta:               Anterior

 Amniotic Fluid
 AFI FV:      Within normal limits

 AFI Sum(cm)     %Tile       Largest Pocket(cm)
 7.67            5

 RUQ(cm)       RLQ(cm)       LUQ(cm)        LLQ(cm)
 2.6           0
Biophysical Evaluation

 Amniotic F.V:   Pocket => 2 cm             F. Tone:        Observed
 F. Movement:    Observed                   Score:          [DATE]
 F. Breathing:   Observed
OB History

 Gravidity:    2          SAB:   1
 Living:       0
Gestational Age

 Best:          36w 3d     Det. By:  Previous Ultrasound      EDD:   09/26/19
Anatomy

 Thoracic:              Appears normal         Abdomen:                Appears normal
 Diaphragm:             Appears normal         Kidneys:                Appear normal
 Stomach:               Appears normal, left   Bladder:                Appears normal
                        sided
Cervix Uterus Adnexa

 Cervix
 Not visualized (advanced GA >30wks)
Impression

 Patient returned for antenatal testing.
 Amniotic fluid is normal (5th percentile) and good fetal activity
 is seen. Antenatal testing is reassuring. BPP [DATE]. BREECH
 presentation.

 Patient has chronic hypertension controlled by labetalol.
 Oligohydramnios has resolved. Timing of delivery for chronic
 hypertension controlled on medications is between 37 and 39-
 6 weeks (ACOG recommendation). Given that she had
 oligohydramnios, delivery may be considered at 37 or 38
 weeks after discussing with the patient.

 I reviewed the NST (reactive). In my opinion, she can be
 discharged.
                 Gh, Bilocan

## 2022-11-28 ENCOUNTER — Emergency Department (HOSPITAL_BASED_OUTPATIENT_CLINIC_OR_DEPARTMENT_OTHER)
Admission: EM | Admit: 2022-11-28 | Discharge: 2022-11-28 | Disposition: A | Discharge status: Home / Self Care | Payer: Commercial Managed Care - HMO | Attending: Emergency Medicine | Admitting: Emergency Medicine

## 2022-11-28 ENCOUNTER — Other Ambulatory Visit: Payer: Self-pay

## 2022-11-28 ENCOUNTER — Encounter (HOSPITAL_BASED_OUTPATIENT_CLINIC_OR_DEPARTMENT_OTHER): Payer: Self-pay | Admitting: Emergency Medicine

## 2022-11-28 ENCOUNTER — Emergency Department (HOSPITAL_BASED_OUTPATIENT_CLINIC_OR_DEPARTMENT_OTHER): Payer: Commercial Managed Care - HMO

## 2022-11-28 DIAGNOSIS — R109 Unspecified abdominal pain: Secondary | ICD-10-CM | POA: Diagnosis present

## 2022-11-28 DIAGNOSIS — D72829 Elevated white blood cell count, unspecified: Secondary | ICD-10-CM | POA: Insufficient documentation

## 2022-11-28 DIAGNOSIS — N200 Calculus of kidney: Secondary | ICD-10-CM | POA: Diagnosis not present

## 2022-11-28 LAB — BASIC METABOLIC PANEL
Anion gap: 9 (ref 5–15)
BUN: 21 mg/dL — ABNORMAL HIGH (ref 6–20)
CO2: 20 mmol/L — ABNORMAL LOW (ref 22–32)
Calcium: 8.8 mg/dL — ABNORMAL LOW (ref 8.9–10.3)
Chloride: 103 mmol/L (ref 98–111)
Creatinine, Ser: 1.45 mg/dL — ABNORMAL HIGH (ref 0.44–1.00)
GFR, Estimated: 49 mL/min — ABNORMAL LOW (ref >60)
Glucose, Bld: 117 mg/dL — ABNORMAL HIGH (ref 70–99)
Potassium: 3.4 mmol/L — ABNORMAL LOW (ref 3.5–5.1)
Sodium: 132 mmol/L — ABNORMAL LOW (ref 135–145)

## 2022-11-28 LAB — URINALYSIS, ROUTINE W REFLEX MICROSCOPIC
Glucose, UA: NEGATIVE mg/dL
Ketones, ur: 15 mg/dL — AB
Leukocytes,Ua: NEGATIVE
Nitrite: NEGATIVE
Protein, ur: 100 mg/dL — AB
Specific Gravity, Urine: >=1.03 (ref 1.005–1.030)
pH: 5.5 (ref 5.0–8.0)

## 2022-11-28 LAB — CBC WITH DIFFERENTIAL/PLATELET
Abs Immature Granulocytes: 0.05 10*3/uL (ref 0.00–0.07)
Basophils Absolute: 0 10*3/uL (ref 0.0–0.1)
Basophils Relative: 0 %
Eosinophils Absolute: 0 10*3/uL (ref 0.0–0.5)
Eosinophils Relative: 0 %
HCT: 37.9 % (ref 36.0–46.0)
Hemoglobin: 13 g/dL (ref 12.0–15.0)
Immature Granulocytes: 0 %
Lymphocytes Relative: 10 %
Lymphs Abs: 1.2 10*3/uL (ref 0.7–4.0)
MCH: 30.4 pg (ref 26.0–34.0)
MCHC: 34.3 g/dL (ref 30.0–36.0)
MCV: 88.8 fL (ref 80.0–100.0)
Monocytes Absolute: 0.6 10*3/uL (ref 0.1–1.0)
Monocytes Relative: 5 %
Neutro Abs: 10.1 10*3/uL — ABNORMAL HIGH (ref 1.7–7.7)
Neutrophils Relative %: 85 %
Platelets: 276 10*3/uL (ref 150–400)
RBC: 4.27 MIL/uL (ref 3.87–5.11)
RDW: 12.6 % (ref 11.5–15.5)
WBC: 12.1 10*3/uL — ABNORMAL HIGH (ref 4.0–10.5)
nRBC: 0 % (ref 0.0–0.2)

## 2022-11-28 LAB — URINALYSIS, MICROSCOPIC (REFLEX)

## 2022-11-28 LAB — PREGNANCY, URINE: Preg Test, Ur: NEGATIVE

## 2022-11-28 MED ORDER — SODIUM CHLORIDE 0.9 % IV BOLUS
1000.0000 mL | Freq: Once | INTRAVENOUS | Status: AC
  Administered 2022-11-28: 1000 mL via INTRAVENOUS

## 2022-11-28 MED ORDER — TAMSULOSIN HCL 0.4 MG PO CAPS: 0.4000 mg | ORAL_CAPSULE | Freq: Every day | ORAL | 0 refills | Status: AC

## 2022-11-28 MED ORDER — KETOROLAC TROMETHAMINE 30 MG/ML IJ SOLN
15.0000 mg | Freq: Once | INTRAMUSCULAR | Status: AC
  Administered 2022-11-28: 15 mg via INTRAVENOUS
  Filled 2022-11-28: qty 1

## 2022-11-28 MED ORDER — ONDANSETRON 8 MG PO TBDP: 8.0000 mg | ORAL_TABLET | Freq: Three times a day (TID) | ORAL | 0 refills | Status: AC | PRN

## 2022-11-28 MED ORDER — HYDROMORPHONE HCL 1 MG/ML IJ SOLN
1.0000 mg | Freq: Once | INTRAMUSCULAR | Status: AC
  Administered 2022-11-28: 1 mg via INTRAVENOUS
  Filled 2022-11-28: qty 1

## 2022-11-28 MED ORDER — FENTANYL CITRATE PF 50 MCG/ML IJ SOSY
50.0000 ug | PREFILLED_SYRINGE | Freq: Once | INTRAMUSCULAR | Status: AC
  Administered 2022-11-28: 50 ug via INTRAVENOUS
  Filled 2022-11-28: qty 1

## 2022-11-28 MED ORDER — SODIUM CHLORIDE 0.9 % IV BOLUS: 1000.0000 mL | Freq: Once | INTRAVENOUS | Status: DC

## 2022-11-28 MED ORDER — ONDANSETRON HCL 4 MG/2ML IJ SOLN
4.0000 mg | Freq: Once | INTRAMUSCULAR | Status: AC
  Administered 2022-11-28: 4 mg via INTRAVENOUS
  Filled 2022-11-28: qty 2

## 2022-11-28 MED ORDER — OXYCODONE-ACETAMINOPHEN 5-325 MG PO TABS: 1.0000 | ORAL_TABLET | Freq: Four times a day (QID) | ORAL | 0 refills | Status: DC | PRN

## 2022-11-28 NOTE — Discharge Instructions (Addendum)
You are seen today in the emergency department kidney stones.  You have a 6 mm stone in the proximal ureter, this should pass on its own.  Take the Flomax in the evenings.  Take Percocet every 6 hours as needed for severe pain, take it with Zofran to prevent nausea and vomiting.  Call and schedule an appointment with urology for reevaluation next week or so.  If you are unable to eat or drink, you have fevers, you have severe pain that is uncontrollable at home you should return back to the ED for additional evaluation.

## 2022-11-28 NOTE — ED Provider Notes (Signed)
[facility] EMERGENCY DEPARTMENT Provider Note   CSN: [id number] Arrival date & time: 11/28/22  1141     History  Chief Complaint  Patient presents with   Flank Pain    Anna Nelson is a 37 y.o. female.   Flank Pain     Patient presents to the emergency department due to right flank pain since last night.  It is constant, associate with nausea and vomiting.  Denies any dysuria or fevers at home.  She has had hematuria.  History of previous kidney stones (>10 episodes), no history of requiring lithotripsy or surgical intervention.  History of previous C-section appendectomy, no other surgeries.  No meds prior to arrival.  Home Medications Prior to Admission medications   Medication Sig Start Date End Date Taking? Authorizing Provider  ondansetron (ZOFRAN-ODT) 8 MG disintegrating tablet Take 1 tablet (8 mg total) by mouth every 8 (eight) hours as needed for nausea or vomiting. 11/28/22  Yes Theron Arista, PA-C  oxyCODONE-acetaminophen (PERCOCET/ROXICET) 5-325 MG tablet Take 1 tablet by mouth every 6 (six) hours as needed for severe pain. 11/28/22  Yes Theron Arista, PA-C  tamsulosin (FLOMAX) 0.4 MG CAPS capsule Take 1 capsule (0.4 mg total) by mouth daily after supper. 11/28/22  Yes Theron Arista, PA-C  docusate sodium (COLACE) 100 MG capsule Take 100 mg by mouth daily as needed for mild constipation.    [provider]  ferrous sulfate 325 (65 FE) MG tablet Take 325 mg by mouth daily with breakfast.    [provider]  ibuprofen (ADVIL) 600 MG tablet Take 1 tablet (600 mg total) by mouth every 6 (six) hours as needed. 09/08/19   Marlow Baars, MD  labetalol (NORMODYNE) 200 MG tablet Take 200 mg by mouth 2 (two) times daily. 08/16/19   [provider]  oxyCODONE (OXY IR/ROXICODONE) 5 MG immediate release tablet Take 1 tablet (5 mg total) by mouth every 6 (six) hours as needed for severe pain. 09/08/19   Marlow Baars, MD  Prenatal Vit-Fe Fumarate-FA  (PRENATAL MULTIVITAMIN) TABS tablet Take 1 tablet by mouth daily at 12 noon.    [provider]      Allergies    Patient has no known allergies.    Review of Systems   Review of Systems  Genitourinary:  Positive for flank pain.    Physical Exam Updated Vital Signs BP 124/68   Pulse 63   Temp 98.9 F (37.2 C) (Oral)   Resp 16   Ht 5' (1.524 m)   Wt 72.1 kg   LMP 11/14/2022   SpO2 99%   BMI 31.05 kg/m  Physical Exam Vitals and nursing note reviewed. Exam conducted with a chaperone present.  Constitutional:      Appearance: Normal appearance.  HENT:     Head: Normocephalic and atraumatic.  Eyes:     General: No scleral icterus.       Right eye: No discharge.        Left eye: No discharge.     Extraocular Movements: Extraocular movements intact.     Pupils: Pupils are equal, round, and reactive to light.  Cardiovascular:     Rate and Rhythm: Normal rate and regular rhythm.     Pulses: Normal pulses.     Heart sounds: Normal heart sounds. No murmur heard.    No friction rub. No gallop.  Pulmonary:     Effort: Pulmonary effort is normal. No respiratory distress.     Breath sounds: Normal breath sounds.  Abdominal:     General: Abdomen is flat. Bowel sounds are normal. There is no distension.     Palpations: Abdomen is soft.     Tenderness: There is no abdominal tenderness. There is right CVA tenderness.  Skin:    General: Skin is warm and dry.     Coloration: Skin is not jaundiced.  Neurological:     Mental Status: She is alert. Mental status is at baseline.     Coordination: Coordination normal.     ED Results / Procedures / Treatments   Labs (all labs ordered are listed, but only abnormal results are displayed) Labs Reviewed  URINALYSIS, ROUTINE W REFLEX MICROSCOPIC - Abnormal; Notable for the following components:      Result Value   APPearance CLOUDY (*)    Hgb urine dipstick TRACE (*)    Bilirubin Urine SMALL (*)    Ketones, ur 15 (*)     Protein, ur 100 (*)    All other components within normal limits  CBC WITH DIFFERENTIAL/PLATELET - Abnormal; Notable for the following components:   WBC 12.1 (*)    Neutro Abs 10.1 (*)    All other components within normal limits  BASIC METABOLIC PANEL - Abnormal; Notable for the following components:   Sodium 132 (*)    Potassium 3.4 (*)    CO2 20 (*)    Glucose, Bld 117 (*)    BUN 21 (*)    Creatinine, Ser 1.45 (*)    Calcium 8.8 (*)    GFR, Estimated 49 (*)    All other components within normal limits  URINALYSIS, MICROSCOPIC (REFLEX) - Abnormal; Notable for the following components:   Bacteria, UA MANY (*)    All other components within normal limits  PREGNANCY, URINE    EKG None  Radiology CT Renal Stone Study  Result Date: 11/28/2022 CLINICAL DATA:  RT flank pain with n/v x 1 week. EXAM: CT ABDOMEN AND PELVIS WITHOUT CONTRAST TECHNIQUE: Multidetector CT imaging of the abdomen and pelvis was performed following the standard protocol without IV contrast. RADIATION DOSE REDUCTION: This exam was performed according to the departmental dose-optimization program which includes automated exposure control, adjustment of the mA and/or kV according to patient size and/or use of iterative reconstruction technique. COMPARISON:  CT abdomen pelvis 11/14/2019 FINDINGS: Lower chest: No acute abnormality. Evaluation of the abdominal viscera limited by the lack of IV contrast. Hepatobiliary: No focal liver abnormality is seen. Normal appearance of the gallbladder. Pancreas: Unremarkable. No surrounding inflammatory changes. Spleen: Normal in size without focal abnormality. Adrenals/Urinary Tract: Adrenal glands are unremarkable. There is a punctate calculus in the superior left kidney. No left hydronephrosis. There is severe right hydronephrosis and ureterectasis secondary to an obstructing calculus in the superior right ureter measuring 6 mm (series 2, image 39). There is right perinephric  stranding. There are no additional right renal calculi. Urinary bladder is unremarkable. Stomach/Bowel: Stomach is within normal limits. Appendix is not visualized. No evidence of bowel wall thickening, distention, or inflammatory changes. Vascular/Lymphatic: No enlarged abdominal or pelvic lymph nodes. Reproductive: Uterus and bilateral adnexa are unremarkable. Other: No abdominal wall hernia or abnormality. No abdominopelvic ascites. Musculoskeletal: No acute or significant osseous findings. IMPRESSION: 1. Severe right hydronephrosis and ureterectasis secondary to an obstructing 6 mm calculus in the superior right ureter. 2.  Punctate nonobstructing calculus in the left kidney. Electronically Signed   By: Audie Pinto M.D.   On: 11/28/2022 13:01    Procedures Procedures  Medications Ordered in ED Medications  sodium chloride 0.9 % bolus 1,000 mL ( Intravenous Stopped 11/28/22 1220)  ondansetron (ZOFRAN) injection 4 mg (4 mg Intravenous Given 11/28/22 1221)  fentaNYL (SUBLIMAZE) injection 50 mcg (50 mcg Intravenous Given 11/28/22 1222)  ketorolac (TORADOL) 30 MG/ML injection 15 mg (15 mg Intravenous Given 11/28/22 1313)  HYDROmorphone (DILAUDID) injection 1 mg (1 mg Intravenous Given 11/28/22 1315)    ED Course/ Medical Decision Making/ A&P                             Medical Decision Making Amount and/or Complexity of Data Reviewed Labs: ordered. Radiology: ordered.  Risk Prescription drug management.   Patient presents to the emergency department due to right flank pain.  Differential includes but not limited to nephrolithiasis, UTI, pyelonephritis, septic stone.  On exam patient has right CVA tenderness, there is no rash or vesicular lesions, no signs of herpes zoster.  No midline tenderness.  No peritoneal signs.  Nonseptic in appearance. -BP (!) 161/104   Pulse 71   Temp 98.9 F (37.2 C) (Oral)   Resp 18   Ht 5' (1.524 m)   Wt 72.1 kg   LMP 11/14/2022   SpO2 99%   BMI  31.05 kg/m   I ordered CBC, BMP, UA, urine pregnant CT renal study.  Also ordered a liter of fluid bolus as well as fentanyl and Zofran for pain and nausea.  I ordered, viewed and interpreted laboratory workup. CBC with mild leukocytosis with a white count of 23.3 with neutrophilic predominance. UA with hematuria and proteinuria but no underlying infection. BMP shows AKI with a creatinine of 1.45, no gross electrolyte abnormality. Patient is not pregnant.  CT renal study ordered and viewed by myself.  Patient has a 6 mm stone in the right proximal ureter.  No hydronephrosis or stranding.  Stone does not appear infected given there is no leukocytes in the urine.  She is also afebrile.  I considered, nephrology but I do not see any indication for emergent nephrologic intervention.  Patient's pain is not well-controlled currently, I ordered Toradol and Dilaudid.  I reevaluated the patient, pain is improved.  Considered admission but given not septic, no signs of systemic infection, do not feel indicated.  Advise close follow-up with urology, will send home with Flomax, Percocet and Zofran.  Return precaution discussed extensively with patient who verbalized understanding and agreement with the plan.        Final Clinical Impression(s) / ED Diagnoses Final diagnoses:  Nephrolithiasis    Rx / DC Orders ED Discharge Orders          Ordered    oxyCODONE-acetaminophen (PERCOCET/ROXICET) 5-325 MG tablet  Every 6 hours PRN        11/28/22 1339    ondansetron (ZOFRAN-ODT) 8 MG disintegrating tablet  Every 8 hours PRN        11/28/22 1339    tamsulosin (FLOMAX) 0.4 MG CAPS capsule  Daily after supper        11/28/22 [place], Darcie Mellone, PA-C 11/28/22 [place], [place], DO 11/29/22 [phone]

## 2022-11-28 NOTE — ED Triage Notes (Signed)
Pt arrives pov, to triage in wheelchair, c/o RT flank pain with n/v x 1 week. Referral from [facility]

## 2023-11-27 ENCOUNTER — Inpatient Hospital Stay (HOSPITAL_COMMUNITY): Payer: Medicaid Other

## 2023-11-27 ENCOUNTER — Other Ambulatory Visit: Payer: Self-pay

## 2023-11-27 ENCOUNTER — Encounter (HOSPITAL_COMMUNITY): Payer: Self-pay

## 2023-11-27 ENCOUNTER — Inpatient Hospital Stay (HOSPITAL_COMMUNITY)
Admission: AD | Admit: 2023-11-27 | Discharge: 2023-11-30 | DRG: 832 | Disposition: A | Discharge status: Home / Self Care | Payer: Medicaid Other | Attending: Obstetrics and Gynecology | Admitting: Obstetrics and Gynecology

## 2023-11-27 DIAGNOSIS — N132 Hydronephrosis with renal and ureteral calculous obstruction: Secondary | ICD-10-CM | POA: Diagnosis present

## 2023-11-27 DIAGNOSIS — Z3A23 23 weeks gestation of pregnancy: Secondary | ICD-10-CM

## 2023-11-27 DIAGNOSIS — O34219 Maternal care for unspecified type scar from previous cesarean delivery: Secondary | ICD-10-CM | POA: Diagnosis present

## 2023-11-27 DIAGNOSIS — Z79899 Other long term (current) drug therapy: Secondary | ICD-10-CM | POA: Diagnosis not present

## 2023-11-27 DIAGNOSIS — Z1152 Encounter for screening for COVID-19: Secondary | ICD-10-CM | POA: Diagnosis not present

## 2023-11-27 DIAGNOSIS — N138 Other obstructive and reflux uropathy: Principal | ICD-10-CM

## 2023-11-27 DIAGNOSIS — N200 Calculus of kidney: Secondary | ICD-10-CM | POA: Diagnosis not present

## 2023-11-27 DIAGNOSIS — Z87442 Personal history of urinary calculi: Secondary | ICD-10-CM | POA: Diagnosis not present

## 2023-11-27 DIAGNOSIS — O99891 Other specified diseases and conditions complicating pregnancy: Secondary | ICD-10-CM | POA: Diagnosis not present

## 2023-11-27 DIAGNOSIS — N1330 Unspecified hydronephrosis: Secondary | ICD-10-CM | POA: Diagnosis not present

## 2023-11-27 DIAGNOSIS — O10912 Unspecified pre-existing hypertension complicating pregnancy, second trimester: Secondary | ICD-10-CM | POA: Diagnosis present

## 2023-11-27 DIAGNOSIS — R109 Unspecified abdominal pain: Secondary | ICD-10-CM | POA: Diagnosis present

## 2023-11-27 DIAGNOSIS — Z3689 Encounter for other specified antenatal screening: Secondary | ICD-10-CM

## 2023-11-27 LAB — COMPREHENSIVE METABOLIC PANEL
ALT: 15 U/L (ref 0–44)
AST: 18 U/L (ref 15–41)
Albumin: 2.9 g/dL — ABNORMAL LOW (ref 3.5–5.0)
Alkaline Phosphatase: 46 U/L (ref 38–126)
Anion gap: 9 (ref 5–15)
BUN: 8 mg/dL (ref 6–20)
CO2: 19 mmol/L — ABNORMAL LOW (ref 22–32)
Calcium: 8.9 mg/dL (ref 8.9–10.3)
Chloride: 105 mmol/L (ref 98–111)
Creatinine, Ser: 0.66 mg/dL (ref 0.44–1.00)
GFR, Estimated: >60 mL/min (ref >60)
Glucose, Bld: 85 mg/dL (ref 70–99)
Potassium: 3.4 mmol/L — ABNORMAL LOW (ref 3.5–5.1)
Sodium: 133 mmol/L — ABNORMAL LOW (ref 135–145)
Total Bilirubin: 0.6 mg/dL (ref 0.0–1.2)
Total Protein: 6.9 g/dL (ref 6.5–8.1)

## 2023-11-27 LAB — URINALYSIS, ROUTINE W REFLEX MICROSCOPIC
Bilirubin Urine: NEGATIVE
Glucose, UA: NEGATIVE mg/dL
Ketones, ur: 5 mg/dL — AB
Nitrite: NEGATIVE
Protein, ur: NEGATIVE mg/dL
Specific Gravity, Urine: 1.006 (ref 1.005–1.030)
pH: 8 (ref 5.0–8.0)

## 2023-11-27 LAB — CBC
HCT: 32.5 % — ABNORMAL LOW (ref 36.0–46.0)
Hemoglobin: 11.2 g/dL — ABNORMAL LOW (ref 12.0–15.0)
MCH: 31.5 pg (ref 26.0–34.0)
MCHC: 34.5 g/dL (ref 30.0–36.0)
MCV: 91.3 fL (ref 80.0–100.0)
Platelets: 282 10*3/uL (ref 150–400)
RBC: 3.56 MIL/uL — ABNORMAL LOW (ref 3.87–5.11)
RDW: 12.8 % (ref 11.5–15.5)
WBC: 10.7 10*3/uL — ABNORMAL HIGH (ref 4.0–10.5)
nRBC: 0 % (ref 0.0–0.2)

## 2023-11-27 LAB — TYPE AND SCREEN
ABO/RH(D): A POS
Antibody Screen: NEGATIVE

## 2023-11-27 MED ORDER — CALCIUM CARBONATE ANTACID 500 MG PO CHEW: 2.0000 | CHEWABLE_TABLET | ORAL | Status: DC | PRN

## 2023-11-27 MED ORDER — ACETAMINOPHEN 325 MG PO TABS
650.0000 mg | ORAL_TABLET | ORAL | Status: DC | PRN
  Administered 2023-11-30: 650 mg via ORAL
  Filled 2023-11-27: qty 2

## 2023-11-27 MED ORDER — LACTATED RINGERS IV BOLUS
1000.0000 mL | Freq: Once | INTRAVENOUS | Status: AC
  Administered 2023-11-27: 1000 mL via INTRAVENOUS

## 2023-11-27 MED ORDER — OXYCODONE HCL 5 MG PO TABS
10.0000 mg | ORAL_TABLET | Freq: Once | ORAL | Status: AC
  Administered 2023-11-27: 10 mg via ORAL
  Filled 2023-11-27: qty 2

## 2023-11-27 MED ORDER — LACTATED RINGERS IV SOLN: 125.0000 mL/h | INTRAVENOUS | Status: AC

## 2023-11-27 MED ORDER — MORPHINE SULFATE (PF) 4 MG/ML IV SOLN
2.0000 mg | INTRAVENOUS | Status: DC | PRN
  Administered 2023-11-28 (×2): 2 mg via INTRAVENOUS
  Filled 2023-11-27 (×2): qty 1

## 2023-11-27 MED ORDER — LACTATED RINGERS IV SOLN
INTRAVENOUS | Status: AC
Start: 2023-11-27 — End: 2023-11-28

## 2023-11-27 MED ORDER — ONDANSETRON HCL 4 MG/2ML IJ SOLN
4.0000 mg | Freq: Once | INTRAMUSCULAR | Status: AC
  Administered 2023-11-27: 4 mg via INTRAVENOUS
  Filled 2023-11-27: qty 2

## 2023-11-27 MED ORDER — ZOLPIDEM TARTRATE 5 MG PO TABS: 5.0000 mg | ORAL_TABLET | Freq: Every evening | ORAL | Status: DC | PRN

## 2023-11-27 MED ORDER — ONDANSETRON HCL 4 MG/2ML IJ SOLN
4.0000 mg | Freq: Four times a day (QID) | INTRAMUSCULAR | Status: DC
Start: 2023-11-27 — End: 2023-11-30
  Administered 2023-11-27 – 2023-11-30 (×9): 4 mg via INTRAVENOUS
  Filled 2023-11-27 (×9): qty 2

## 2023-11-27 MED ORDER — NIFEDIPINE ER OSMOTIC RELEASE 30 MG PO TB24
30.0000 mg | ORAL_TABLET | Freq: Once | ORAL | Status: AC
  Administered 2023-11-27: 30 mg via ORAL
  Filled 2023-11-27: qty 1

## 2023-11-27 MED ORDER — PRENATAL MULTIVITAMIN CH
1.0000 | ORAL_TABLET | Freq: Every day | ORAL | Status: DC
  Administered 2023-11-30: 1 via ORAL
  Filled 2023-11-27 (×2): qty 1

## 2023-11-27 MED ORDER — TAMSULOSIN HCL 0.4 MG PO CAPS
0.4000 mg | ORAL_CAPSULE | Freq: Every day | ORAL | Status: DC
  Administered 2023-11-27 – 2023-11-30 (×4): 0.4 mg via ORAL
  Filled 2023-11-27 (×6): qty 1

## 2023-11-27 MED ORDER — MORPHINE SULFATE (PF) 4 MG/ML IV SOLN
2.0000 mg | Freq: Once | INTRAVENOUS | Status: AC
  Administered 2023-11-27: 2 mg via INTRAVENOUS
  Filled 2023-11-27: qty 1

## 2023-11-27 MED ORDER — DOCUSATE SODIUM 100 MG PO CAPS
100.0000 mg | ORAL_CAPSULE | Freq: Every day | ORAL | Status: DC
  Administered 2023-11-28 – 2023-11-30 (×2): 100 mg via ORAL
  Filled 2023-11-27 (×2): qty 1

## 2023-11-27 NOTE — MAU Provider Note (Signed)
 MAU Provider Note  Chief Complaint: Back Pain  SUBJECTIVE HPI: Anna Nelson is a 38 y.o. G3P1011 at [redacted]w[redacted]d by LMP who presents to maternity admissions reporting L flank pain since Monday. Pregnancy c/b cHTN on Procardia . Receives [facility] with [facility].  Patient notes history of stones. States this feels very similar. Pain started on L side Monday and radiates to groin. Did pass some sediment/small piece of suspected stone on Tuesday. Pain initially improved but came back quickly. 7/10 pain currently. Very nauseous and hasn't been able to eat or drink much. Denies fever/chills, dysuria.  +FM. Denies VB, LOF, lower abdominal cramping/contractions.   HPI  Past Medical History:  Diagnosis Date   Gestational diabetes    Hypertension    Past Surgical History:  Procedure Laterality Date   CESAREAN SECTION N/A 09/06/2019   Procedure: CESAREAN SECTION;  Surgeon: Lenon Oneil BRAVO, MD;  Location: [facility] LD ORS;  Service: Obstetrics;  Laterality: N/A;   WISDOM TOOTH EXTRACTION     Social History   Socioeconomic History   Marital status: Married    Spouse name: Not on file   Number of children: Not on file   Years of education: Not on file   Highest education level: Not on file  Occupational History   Not on file  Tobacco Use   Smoking status: Never   Smokeless tobacco: Never  Vaping Use   Vaping status: Never Used  Substance and Sexual Activity   Alcohol use: Never   Drug use: Never   Sexual activity: Yes  Other Topics Concern   Not on file  Social History Narrative   Not on file   Social Drivers of Health   Financial Resource Strain: Not on file  Food Insecurity: Not on file  Transportation Needs: Not on file  Physical Activity: Not on file  Stress: Not on file  Social Connections: Not on file  Intimate Partner Violence: Not on file   No current facility-administered medications on file prior to encounter.   Current Outpatient Medications on File Prior to  Encounter  Medication Sig Dispense Refill   acetaminophen  (TYLENOL ) 500 MG tablet Take 500 mg by mouth every 6 (six) hours as needed for moderate pain (pain score 4-6).     NIFEdipine  (ADALAT  CC) 30 MG 24 hr tablet Take 30 mg by mouth daily.     Prenatal Vit-Fe Fumarate-FA (PRENATAL MULTIVITAMIN) TABS tablet Take 1 tablet by mouth daily at 12 noon.     promethazine (PHENERGAN) 12.5 MG tablet Take 12.5 mg by mouth every 6 (six) hours as needed for nausea or vomiting.     docusate sodium  (COLACE) 100 MG capsule Take 100 mg by mouth daily as needed for mild constipation.     ferrous sulfate  325 (65 FE) MG tablet Take 325 mg by mouth daily with breakfast.     ibuprofen  (ADVIL ) 600 MG tablet Take 1 tablet (600 mg total) by mouth every 6 (six) hours as needed. 60 tablet 0   labetalol  (NORMODYNE ) 200 MG tablet Take 200 mg by mouth 2 (two) times daily.     ondansetron  (ZOFRAN -ODT) 8 MG disintegrating tablet Take 1 tablet (8 mg total) by mouth every 8 (eight) hours as needed for nausea or vomiting. 20 tablet 0   oxyCODONE  (OXY IR/ROXICODONE ) 5 MG immediate release tablet Take 1 tablet (5 mg total) by mouth every 6 (six) hours as needed for severe pain. 12 tablet 0   oxyCODONE -acetaminophen  (PERCOCET/ROXICET) 5-325 MG tablet Take 1 tablet by  mouth every 6 (six) hours as needed for severe pain. 15 tablet 0   tamsulosin  (FLOMAX ) 0.4 MG CAPS capsule Take 1 capsule (0.4 mg total) by mouth daily after supper. 30 capsule 0   No Known Allergies  ROS:  Pertinent positives/negatives listed above.  I have reviewed patient's Past Medical Hx, Surgical Hx, Family Hx, Social Hx, medications and allergies.   Physical Exam  Patient Vitals for the past 24 hrs:  BP Temp Temp src Pulse Resp SpO2  11/27/23 1616 125/75 -- -- 63 -- --  11/27/23 1615 -- -- -- -- -- 96 %  11/27/23 1559 133/78 -- -- 65 -- --  11/27/23 1537 (!) 148/88 98.2 F (36.8 C) Oral 65 18 --   Constitutional: Well-developed, well-nourished female.  Visibly uncomfortable Cardiovascular: normal rate Respiratory: normal effort GI: Abd soft, non-tender MS: Extremities nontender, no edema, normal ROM Neurologic: Alert and oriented x 4  GU: L CVA tenderness  FHT:  Baseline 140, moderate variability, 10x10 accelerations present, no decelerations Contractions: none  LAB RESULTS Results for orders placed or performed during the hospital encounter of 11/27/23 (from the past 24 hours)  Urinalysis, Routine w reflex microscopic -Urine, Clean Catch     Status: Abnormal   Collection Time: 11/27/23  3:42 PM  Result Value Ref Range   Color, Urine YELLOW YELLOW   APPearance CLEAR CLEAR   Specific Gravity, Urine 1.006 1.005 - 1.030   pH 8.0 5.0 - 8.0   Glucose, UA NEGATIVE NEGATIVE mg/dL   Hgb urine dipstick LARGE (A) NEGATIVE   Bilirubin Urine NEGATIVE NEGATIVE   Ketones, ur 5 (A) NEGATIVE mg/dL   Protein, ur NEGATIVE NEGATIVE mg/dL   Nitrite NEGATIVE NEGATIVE   Leukocytes,Ua TRACE (A) NEGATIVE   RBC / HPF 6-10 0 - 5 RBC/hpf   WBC, UA 0-5 0 - 5 WBC/hpf   Bacteria, UA RARE (A) NONE SEEN   Squamous Epithelial / HPF 6-10 0 - 5 /HPF   Mucus PRESENT   CBC     Status: Abnormal   Collection Time: 11/27/23  3:42 PM  Result Value Ref Range   WBC 10.7 (H) 4.0 - 10.5 K/uL   RBC 3.56 (L) 3.87 - 5.11 MIL/uL   Hemoglobin 11.2 (L) 12.0 - 15.0 g/dL   HCT 67.4 (L) 63.9 - 53.9 %   MCV 91.3 80.0 - 100.0 fL   MCH 31.5 26.0 - 34.0 pg   MCHC 34.5 30.0 - 36.0 g/dL   RDW 87.1 88.4 - 84.4 %   Platelets 282 150 - 400 K/uL   nRBC 0.0 0.0 - 0.2 %  Comprehensive metabolic panel     Status: Abnormal   Collection Time: 11/27/23  3:42 PM  Result Value Ref Range   Sodium 133 (L) 135 - 145 mmol/L   Potassium 3.4 (L) 3.5 - 5.1 mmol/L   Chloride 105 98 - 111 mmol/L   CO2 19 (L) 22 - 32 mmol/L   Glucose, Bld 85 70 - 99 mg/dL   BUN 8 6 - 20 mg/dL   Creatinine, Ser 9.33 0.44 - 1.00 mg/dL   Calcium  8.9 8.9 - 10.3 mg/dL   Total Protein 6.9 6.5 - 8.1 g/dL    Albumin 2.9 (L) 3.5 - 5.0 g/dL   AST 18 15 - 41 U/L   ALT 15 0 - 44 U/L   Alkaline Phosphatase 46 38 - 126 U/L   Total Bilirubin 0.6 0.0 - 1.2 mg/dL   GFR, Estimated >39 >39 mL/min  Anion gap 9 5 - 15       IMAGING CT RENAL STONE STUDY Result Date: 11/27/2023 CLINICAL DATA:  Left flank pain. Patient is approximately [redacted] weeks pregnant EXAM: CT ABDOMEN AND PELVIS WITHOUT CONTRAST TECHNIQUE: Multidetector CT imaging of the abdomen and pelvis was performed following the standard protocol without IV contrast. RADIATION DOSE REDUCTION: This exam was performed according to the departmental dose-optimization program which includes automated exposure control, adjustment of the mA and/or kV according to patient size and/or use of iterative reconstruction technique. COMPARISON:  CT 11/28/2022 FINDINGS: Lower chest: No acute abnormality. Hepatobiliary: Liver not fully imaged within the field of view. Unremarkable unenhanced appearance of the included liver. No focal liver lesion identified. Gallbladder is moderately distended but appears otherwise unremarkable. No hyperdense gallstone. No biliary dilatation. Pancreas: Unremarkable. No pancreatic ductal dilatation or surrounding inflammatory changes. Spleen: Spleen not fully imaged within the field of view. Normal appearance of the imaged spleen. Adrenals/Urinary Tract: Severe left hydroureteronephrosis secondary to a 8 x 8 mm left ureteral stone at the level of the left pelvic sidewall (series 3, image 54). No stones within the left kidney. Tiny punctate stone within the right kidney without hydronephrosis. Urinary bladder within normal limits. Stomach/Bowel: Stomach is within normal limits. Appendix not visualized, likely surgically absent. No evidence of bowel wall thickening, distention, or inflammatory changes. Vascular/Lymphatic: No significant vascular findings are present. No enlarged abdominal or pelvic lymph nodes. Reproductive: Gravid uterus containing  fetus. Transverse fetal lie, head maternal left. No adnexal masses. Other: No free fluid. No abdominopelvic fluid collection. No pneumoperitoneum. Musculoskeletal: No acute or significant osseous findings. IMPRESSION: 1. Severe left hydroureteronephrosis secondary to a 8 x 8 mm left ureteral stone at the level of the left pelvic sidewall. 2. Tiny punctate stone within the right kidney without hydronephrosis. 3. Gravid uterus. Electronically Signed   By: Mabel Converse D.O.   On: 11/27/2023 17:41    MAU Management/MDM: Orders Placed This Encounter  Procedures   CT RENAL STONE STUDY   Urinalysis, Routine w reflex microscopic -Urine, Clean Catch   CBC   Comprehensive metabolic panel    Meds ordered this encounter  Medications   lactated ringers  bolus 1,000 mL   morphine  (PF) 4 MG/ML injection 2 mg   ondansetron  (ZOFRAN ) injection 4 mg   morphine  (PF) 4 MG/ML injection 2 mg   oxyCODONE  (Oxy IR/ROXICODONE ) immediate release tablet 10 mg    Refill:  0     Available prenatal records reviewed.  Patient presents for L-flank pain with history of recurrent nephrolithiasis. Do suspect she has another stone at this time. Obtained CBC, CMP, UA, CT stone for further evaluation. LR, morphine , zofran  for symptom management.  1753: Results showed 8 mm x8 mm obstructing stone. Discussed case with urologist on call, Dr. Elisabeth. Her recommendations were pain management, fluids, and flomax  to help stone pass on its own. If pain could be controlled outpatient, outpatient with strict return precautions was acceptable, however inpatient management for pain would also be appropriate. Trial of PO oxycodone  for pain management.  1900: Patient with insufficient pain control from PO oxycodone . At this time, meets criteria for inpatient management. Dr. Okey will admit to antepartum.  FWB: Reactive tracing for gestation and +FM.  ASSESSMENT 1. Urinary tract obstruction by kidney stone   2. Hydroureteronephrosis    3. NST (non-stress test) reactive   4. [redacted] weeks gestation of pregnancy     PLAN Dr. Okey to admit to antepartum.  Almarie Moats,  MD OB Fellow 11/27/2023  7:07 PM

## 2023-11-27 NOTE — Progress Notes (Signed)
Pt transported to CT via wheelchair with transport personnel.

## 2023-11-27 NOTE — Progress Notes (Signed)
 Transferred to [facility] via WC by nt

## 2023-11-27 NOTE — H&P (Addendum)
 Anna Nelson is a 38 y.o. female presenting for pain management  38 yo G3P1011 @ 23+2 presents for evaluation of left flank pain. Pt has a history of kidney stones . Pain started on Monday. On Tuesday she passed a small stone. Her pain initially improved but returned. CT stone study showed 8 x 8 mm left ureteral stone at the level of the pelvic sidewall. Severe hydronephrosis is noted on the left. Patient' spain could not be controlled with po medications in mAU. Therefore the patient will be admitted to antepartum for pain management  Pregnancy Problems 1) Advanced Maternal Age 2) Chronic Hypertension on Procardia  30XL 3) H/O Nephrolithiosis 4) H/O cesarean Delivery OB History     Gravida  3   Para  1   Term  1   Preterm      AB  1   Living  1      SAB  1   IAB      Ectopic      Multiple  0   Live Births  1          Past Medical History:  Diagnosis Date   Gestational diabetes    Hypertension    Past Surgical History:  Procedure Laterality Date   CESAREAN SECTION N/A 09/06/2019   Procedure: CESAREAN SECTION;  Surgeon: Lenon Oneil BRAVO, MD;  Location: [facility];  Service: Obstetrics;  Laterality: N/A;   WISDOM TOOTH EXTRACTION     Family History: family history includes Diabetes in her father; Lymphoma in her maternal grandmother. Social History:  reports that she has never smoked. She has never used smokeless tobacco. She reports that she does not drink alcohol and does not use drugs.     Maternal Diabetes: No Genetic Screening: Normal Maternal Ultrasounds/Referrals: Normal Fetal Ultrasounds or other Referrals:  None Maternal Substance Abuse:  No Significant Maternal Medications:  None Significant Maternal Lab Results:  None Number of Prenatal Visits:greater than 3 verified prenatal visits Other Comments:  None  Review of Systems History   Blood pressure 125/75, pulse 63, temperature 98.2 F (36.8 C), temperature source Oral, resp. rate 18, last  menstrual period 06/17/2023, SpO2 96%, unknown if currently breastfeeding. Exam Physical Exam  Vitals:   11/27/23 1537 11/27/23 1559 11/27/23 1615 11/27/23 1616  BP: (!) 148/88 133/78  125/75  Pulse: 65 65  63  Resp: 18     Temp: 98.2 F (36.8 C)     TempSrc: Oral     SpO2:   96%    AOx3, uncomfortable appearing Gravid, soft + Left CVA tenderness FHR cat 1 tracing for 23 wwk GA Prenatal labs: ABO, Rh:  A pos Antibody:  Neg Rubella:  Imm RPR:   NR HBsAg:   Neg HIV:   NR GBS:   unknown Results for orders placed or performed during the hospital encounter of 11/27/23 (from the past 24 hours)  Urinalysis, Routine w reflex microscopic -Urine, Clean Catch     Status: Abnormal   Collection Time: 11/27/23  3:42 PM  Result Value Ref Range   Color, Urine YELLOW YELLOW   APPearance CLEAR CLEAR   Specific Gravity, Urine 1.006 1.005 - 1.030   pH 8.0 5.0 - 8.0   Glucose, UA NEGATIVE NEGATIVE mg/dL   Hgb urine dipstick LARGE (A) NEGATIVE   Bilirubin Urine NEGATIVE NEGATIVE   Ketones, ur 5 (A) NEGATIVE mg/dL   Protein, ur NEGATIVE NEGATIVE mg/dL   Nitrite NEGATIVE NEGATIVE   Leukocytes,Ua TRACE (A) NEGATIVE  RBC / HPF 6-10 0 - 5 RBC/hpf   WBC, UA 0-5 0 - 5 WBC/hpf   Bacteria, UA RARE (A) NONE SEEN   Squamous Epithelial / HPF 6-10 0 - 5 /HPF   Mucus PRESENT   CBC     Status: Abnormal   Collection Time: 11/27/23  3:42 PM  Result Value Ref Range   WBC 10.7 (H) 4.0 - 10.5 K/uL   RBC 3.56 (L) 3.87 - 5.11 MIL/uL   Hemoglobin 11.2 (L) 12.0 - 15.0 g/dL   HCT 67.4 (L) 63.9 - 53.9 %   MCV 91.3 80.0 - 100.0 fL   MCH 31.5 26.0 - 34.0 pg   MCHC 34.5 30.0 - 36.0 g/dL   RDW 87.1 88.4 - 84.4 %   Platelets 282 150 - 400 K/uL   nRBC 0.0 0.0 - 0.2 %  Comprehensive metabolic panel     Status: Abnormal   Collection Time: 11/27/23  3:42 PM  Result Value Ref Range   Sodium 133 (L) 135 - 145 mmol/L   Potassium 3.4 (L) 3.5 - 5.1 mmol/L   Chloride 105 98 - 111 mmol/L   CO2 19 (L) 22 - 32 mmol/L    Glucose, Bld 85 70 - 99 mg/dL   BUN 8 6 - 20 mg/dL   Creatinine, Ser 9.33 0.44 - 1.00 mg/dL   Calcium  8.9 8.9 - 10.3 mg/dL   Total Protein 6.9 6.5 - 8.1 g/dL   Albumin 2.9 (L) 3.5 - 5.0 g/dL   AST 18 15 - 41 U/L   ALT 15 0 - 44 U/L   Alkaline Phosphatase 46 38 - 126 U/L   Total Bilirubin 0.6 0.0 - 1.2 mg/dL   GFR, Estimated >39 >39 mL/min   Anion gap 9 5 - 15   CT RENAL STONE STUDY 11/27/2023  Narrative CLINICAL DATA:  Left flank pain. Patient is approximately [redacted] weeks pregnant  EXAM: CT ABDOMEN AND PELVIS WITHOUT CONTRAST  TECHNIQUE: Multidetector CT imaging of the abdomen and pelvis was performed following the standard protocol without IV contrast.  RADIATION DOSE REDUCTION: This exam was performed according to the departmental dose-optimization program which includes automated exposure control, adjustment of the mA and/or kV according to patient size and/or use of iterative reconstruction technique.  COMPARISON:  CT 11/28/2022  FINDINGS: Lower chest: No acute abnormality.  Hepatobiliary: Liver not fully imaged within the field of view. Unremarkable unenhanced appearance of the included liver. No focal liver lesion identified. Gallbladder is moderately distended but appears otherwise unremarkable. No hyperdense gallstone. No biliary dilatation.  Pancreas: Unremarkable. No pancreatic ductal dilatation or surrounding inflammatory changes.  Spleen: Spleen not fully imaged within the field of view. Normal appearance of the imaged spleen.  Adrenals/Urinary Tract: Severe left hydroureteronephrosis secondary to a 8 x 8 mm left ureteral stone at the level of the left pelvic sidewall (series 3, image 54). No stones within the left kidney. Tiny punctate stone within the right kidney without hydronephrosis. Urinary bladder within normal limits.  Stomach/Bowel: Stomach is within normal limits. Appendix not visualized, likely surgically absent. No evidence of bowel  wall thickening, distention, or inflammatory changes.  Vascular/Lymphatic: No significant vascular findings are present. No enlarged abdominal or pelvic lymph nodes.  Reproductive: Gravid uterus containing fetus. Transverse fetal lie, head maternal left. No adnexal masses.  Other: No free fluid. No abdominopelvic fluid collection. No pneumoperitoneum.  Musculoskeletal: No acute or significant osseous findings.  IMPRESSION: 1. Severe left hydroureteronephrosis secondary to a 8 x 8 mm  left ureteral stone at the level of the left pelvic sidewall. 2. Tiny punctate stone within the right kidney without hydronephrosis. 3. Gravid uterus.   Electronically Signed By: Mabel Converse D.O. On: 11/27/2023 17:41   Assessment/Plan: 1) Admit 2) IVF hydration 3) Pain management with dilaudid  PCA 4) Tamsulosin  po daily 5) Case discussed with Urologist on call. Pt may require urologic intervention   Marjorie Gull 11/27/2023, 7:23 PM

## 2023-11-27 NOTE — Progress Notes (Signed)
 Pt admitted, waiting for bed in [facility]

## 2023-11-27 NOTE — MAU Note (Signed)
 Anna Nelson is a 38 y.o. at [redacted]w[redacted]d here in MAU reporting: left lower back pain and pressure that started on Monday. Pt states she has hx of kidney stones and feels like she passed part of one on Tuesday. Reports blood in her urine. Reports ongoing N/V that she took phenergan for at 0815 today. Denies any fevers. Denies any CTXs, LOF or VB. Reports feeling fetal movement/flutters.   LMP: n/a Onset of complaint: Monday 11/22/23 Pain score: 7 back  5 abdomen   left  Vitals:   11/27/23 1537  BP: (!) 148/88  Pulse: 65  Resp: 18  Temp: 98.2 F (36.8 C)     FHT:145 Lab orders placed from triage: UA

## 2023-11-28 MED ORDER — DIPHENHYDRAMINE HCL 50 MG/ML IJ SOLN: 12.5000 mg | Freq: Four times a day (QID) | INTRAMUSCULAR | Status: DC | PRN

## 2023-11-28 MED ORDER — ORAL CARE MOUTH RINSE: 15.0000 mL | OROMUCOSAL | Status: DC | PRN

## 2023-11-28 MED ORDER — ONDANSETRON HCL 4 MG/2ML IJ SOLN: 4.0000 mg | Freq: Four times a day (QID) | INTRAMUSCULAR | Status: DC | PRN

## 2023-11-28 MED ORDER — DIPHENHYDRAMINE HCL 12.5 MG/5ML PO ELIX: 12.5000 mg | ORAL_SOLUTION | Freq: Four times a day (QID) | ORAL | Status: DC | PRN

## 2023-11-28 MED ORDER — NIFEDIPINE ER OSMOTIC RELEASE 30 MG PO TB24
30.0000 mg | ORAL_TABLET | Freq: Every day | ORAL | Status: DC
  Administered 2023-11-28 – 2023-11-29 (×2): 30 mg via ORAL
  Filled 2023-11-28 (×2): qty 1

## 2023-11-28 MED ORDER — NALOXONE HCL 0.4 MG/ML IJ SOLN: 0.4000 mg | INTRAMUSCULAR | Status: DC | PRN

## 2023-11-28 MED ORDER — HYDROMORPHONE 1 MG/ML IV SOLN
INTRAVENOUS | Status: DC
Start: 2023-11-28 — End: 2023-11-29
  Administered 2023-11-28: 30 mg via INTRAVENOUS
  Administered 2023-11-29 (×3): 0.3 mg via INTRAVENOUS
  Filled 2023-11-28: qty 30

## 2023-11-28 MED ORDER — OXYCODONE HCL 5 MG PO TABS
5.0000 mg | ORAL_TABLET | ORAL | Status: DC | PRN
  Administered 2023-11-28 – 2023-11-30 (×2): 10 mg via ORAL
  Filled 2023-11-28: qty 1
  Filled 2023-11-28: qty 2
  Filled 2023-11-28: qty 1

## 2023-11-28 MED ORDER — SODIUM CHLORIDE 0.9% FLUSH: 9.0000 mL | INTRAVENOUS | Status: DC | PRN

## 2023-11-28 NOTE — Consult Note (Signed)
 I have been asked to see the patient by Dr. Marjorie Gull for evaluation and management of left ureteral calculus.  History of present illness: 38 yo woman currently pregnant [redacted]weeks+2 presented with left flank pain. CT showed 8mm left mid ureteral calculus with severe hydronephrosis.  Patient has required admission for pain control.  He continues to have nausea and vomiting.  No fevers, chills or signs of infection.  She has a history of urolithiasis and previously underwent ureteroscopy with Dr. Marda in [place].   Review of systems: A 12 point comprehensive review of systems was obtained and is negative unless otherwise stated in the history of present illness.  Patient Active Problem List   Diagnosis Date Noted   Nephrolithiasis 11/27/2023   Pregnancy 09/06/2019   Gestational diabetes mellitus 09/01/2019   Hypertension, essential 09/01/2019   Oligohydramnios antepartum, third trimester, fetus 1 08/28/2019    No current facility-administered medications on file prior to encounter.   Current Outpatient Medications on File Prior to Encounter  Medication Sig Dispense Refill   acetaminophen  (TYLENOL ) 500 MG tablet Take 500 mg by mouth every 6 (six) hours as needed for moderate pain (pain score 4-6).     NIFEdipine  (ADALAT  CC) 30 MG 24 hr tablet Take 30 mg by mouth daily.     Prenatal Vit-Fe Fumarate-FA (PRENATAL MULTIVITAMIN) TABS tablet Take 1 tablet by mouth daily at 12 noon.     promethazine (PHENERGAN) 12.5 MG tablet Take 12.5 mg by mouth every 6 (six) hours as needed for nausea or vomiting.     docusate sodium  (COLACE) 100 MG capsule Take 100 mg by mouth daily as needed for mild constipation.     ferrous sulfate  325 (65 FE) MG tablet Take 325 mg by mouth daily with breakfast.     ibuprofen  (ADVIL ) 600 MG tablet Take 1 tablet (600 mg total) by mouth every 6 (six) hours as needed. 60 tablet 0   labetalol  (NORMODYNE ) 200 MG tablet Take 200 mg by mouth 2 (two) times daily.     ondansetron   (ZOFRAN -ODT) 8 MG disintegrating tablet Take 1 tablet (8 mg total) by mouth every 8 (eight) hours as needed for nausea or vomiting. 20 tablet 0   oxyCODONE  (OXY IR/ROXICODONE ) 5 MG immediate release tablet Take 1 tablet (5 mg total) by mouth every 6 (six) hours as needed for severe pain. 12 tablet 0   oxyCODONE -acetaminophen  (PERCOCET/ROXICET) 5-325 MG tablet Take 1 tablet by mouth every 6 (six) hours as needed for severe pain. 15 tablet 0   tamsulosin  (FLOMAX ) 0.4 MG CAPS capsule Take 1 capsule (0.4 mg total) by mouth daily after supper. 30 capsule 0    Past Medical History:  Diagnosis Date   Gestational diabetes    Hypertension     Past Surgical History:  Procedure Laterality Date   CESAREAN SECTION N/A 09/06/2019   Procedure: CESAREAN SECTION;  Surgeon: Lenon Oneil BRAVO, MD;  Location: [facility] LD ORS;  Service: Obstetrics;  Laterality: N/A;   WISDOM TOOTH EXTRACTION      Social History   Tobacco Use   Smoking status: Never   Smokeless tobacco: Never  Vaping Use   Vaping status: Never Used  Substance Use Topics   Alcohol use: Never   Drug use: Never    Family History  Problem Relation Age of Onset   Diabetes Father    Lymphoma Maternal Grandmother     PE: Vitals:   11/27/23 1615 11/27/23 1616 11/27/23 2008 11/27/23 2330  BP:  125/75 ROLLEN)  144/73 135/68  Pulse:  63 (!) 58 69  Resp:   18 17  Temp:   98.7 F (37.1 C) 98.4 F (36.9 C)  TempSrc:   Oral Oral  SpO2: 96%   100%   Patient appears to be in no acute distress  patient is alert and oriented x3 Atraumatic normocephalic head No increased work of breathing, no audible wheezes/rhonchi Regular sinus rhythm/rate Abdomen is pregnant Lower extremities are symmetric without appreciable edema Grossly neurologically intact No identifiable skin lesions  Recent Labs    11/27/23 1542  WBC 10.7*  HGB 11.2*  HCT 32.5*   Recent Labs    11/27/23 1542  NA 133*  K 3.4*  CL 105  CO2 19*  GLUCOSE 85  BUN 8   CREATININE 0.66  CALCIUM  8.9   No results for input(s): LABPT, INR in the last 72 hours. No results for input(s): LABURIN in the last 72 hours. Results for orders placed or performed during the hospital encounter of 09/04/19  SARS Coronavirus 2 by RT PCR (hospital order, performed in [facility] hospital lab) Nasopharyngeal Nasopharyngeal Swab     Status: None   Collection Time: 09/04/19  4:52 PM   Specimen: Nasopharyngeal Swab  Result Value Ref Range Status   SARS Coronavirus 2 NEGATIVE NEGATIVE Final    Comment: (NOTE) If result is NEGATIVE SARS-CoV-2 target nucleic acids are NOT DETECTED. The SARS-CoV-2 RNA is generally detectable in upper and lower  respiratory specimens during the acute phase of infection. The lowest  concentration of SARS-CoV-2 viral copies this assay can detect is 250  copies / mL. A negative result does not preclude SARS-CoV-2 infection  and should not be used as the sole basis for treatment or other  patient management decisions.  A negative result may occur with  improper specimen collection / handling, submission of specimen other  than nasopharyngeal swab, presence of viral mutation(s) within the  areas targeted by this assay, and inadequate number of viral copies  (<250 copies / mL). A negative result must be combined with clinical  observations, patient history, and epidemiological information. If result is POSITIVE SARS-CoV-2 target nucleic acids are DETECTED. The SARS-CoV-2 RNA is generally detectable in upper and lower  respiratory specimens dur ing the acute phase of infection.  Positive  results are indicative of active infection with SARS-CoV-2.  Clinical  correlation with patient history and other diagnostic information is  necessary to determine patient infection status.  Positive results do  not rule out bacterial infection or co-infection with other viruses. If result is PRESUMPTIVE POSTIVE SARS-CoV-2 nucleic acids MAY BE PRESENT.    A presumptive positive result was obtained on the submitted specimen  and confirmed on repeat testing.  While 2019 novel coronavirus  (SARS-CoV-2) nucleic acids may be present in the submitted sample  additional confirmatory testing may be necessary for epidemiological  and / or clinical management purposes  to differentiate between  SARS-CoV-2 and other Sarbecovirus currently known to infect humans.  If clinically indicated additional testing with an alternate test  methodology [phone]) is advised. The SARS-CoV-2 RNA is generally  detectable in upper and lower respiratory sp ecimens during the acute  phase of infection. The expected result is Negative. Fact Sheet for Patients:  boilerbrush.com.cy Fact Sheet for Healthcare Providers: https://pope.com/ This test is not yet approved or cleared by the [place]  FDA and has been authorized for detection and/or diagnosis of SARS-CoV-2 by FDA under an Emergency Use Authorization (EUA).  This EUA  will remain in effect (meaning this test can be used) for the duration of the COVID-19 declaration under Section 564(b)(1) of the Act, 21 U.S.C. section 360bbb-3(b)(1), unless the authorization is terminated or revoked sooner. Performed at [facility], [address], [place], [place] [address]     Imaging: IMPRESSION: 1. Severe left hydroureteronephrosis secondary to a 8 x 8 mm left ureteral stone at the level of the left pelvic sidewall. 2. Tiny punctate stone within the right kidney without hydronephrosis. 3. Gravid uterus.     Electronically Signed   By: Mabel Converse D.O.   On: 11/27/2023 17:41  A/P: Obstructing left ureteral calculus with severe hydronephrosis -Discussed management options of the stone with patient and her family including medical expulsive trial, temporizing measure with either stent or nephrostomy tube, or definitive management ureteroscopy laser  lithotripsy and stent placement. -We discussed the risks and benefits of all of the above options and she has elected to proceed with left ureteroscopy with laser listers and stent placement.  Risks and benefits of the procedure were discussed with the patient including but not limited to pain, bleeding, infection, stent discomfort, inability to remove stone, need for additional procedures or staged procedures, ureteral stricture, damage to surrounding structures. -I discussed with patient that ideally urology cases like this are done at [facility] however there is not OB/GYN or NICU care there.  Based on how far along patient is in pregnancy we decided the safest thing for patient and her baby would be to schedule this at [facility]. -Surgery scheduled for 3 PM on 1/13.  N.p.o. tonight at midnight.   Thank you for involving me in this patient's care, I will continue to follow along.Please page with any further questions or concerns. Jurnie Garritano D Shaneque Merkle

## 2023-11-28 NOTE — Plan of Care (Signed)
  Problem: Education: Goal: Knowledge of disease or condition will improve Outcome: Progressing Goal: Knowledge of the prescribed therapeutic regimen will improve Outcome: Progressing Goal: Individualized Educational Video(s) Outcome: Progressing   Problem: Clinical Measurements: Goal: Complications related to the disease process, condition or treatment will be avoided or minimized Outcome: Progressing   Problem: Education: Goal: Knowledge of General Education information will improve Description: Including pain rating scale, medication(s)/side effects and non-pharmacologic comfort measures Outcome: Progressing   Problem: Health Behavior/Discharge Planning: Goal: Ability to manage health-related needs will improve Outcome: Progressing   Problem: Clinical Measurements: Goal: Ability to maintain clinical measurements within normal limits will improve Outcome: Progressing Goal: Will remain free from infection Outcome: Progressing Goal: Diagnostic test results will improve Outcome: Progressing Goal: Respiratory complications will improve Outcome: Progressing Goal: Cardiovascular complication will be avoided Outcome: Progressing   Problem: Activity: Goal: Risk for activity intolerance will decrease Outcome: Progressing   Problem: Nutrition: Goal: Adequate nutrition will be maintained Outcome: Progressing   Problem: Coping: Goal: Level of anxiety will decrease Outcome: Progressing   Problem: Elimination: Goal: Will not experience complications related to bowel motility Outcome: Progressing Goal: Will not experience complications related to urinary retention Outcome: Progressing   Problem: Pain Management: Goal: General experience of comfort will improve Outcome: Progressing   Problem: Safety: Goal: Ability to remain free from injury will improve Outcome: Progressing   Problem: Skin Integrity: Goal: Risk for impaired skin integrity will decrease Outcome:  Progressing

## 2023-11-28 NOTE — H&P (View-Only) (Signed)
 I have been asked to see the patient by Dr. Marjorie Gull for evaluation and management of left ureteral calculus.  History of present illness: 38 yo woman currently pregnant [redacted]weeks+2 presented with left flank pain. CT showed 8mm left mid ureteral calculus with severe hydronephrosis.  Patient has required admission for pain control.  He continues to have nausea and vomiting.  No fevers, chills or signs of infection.  She has a history of urolithiasis and previously underwent ureteroscopy with Dr. Marda in [place].   Review of systems: A 12 point comprehensive review of systems was obtained and is negative unless otherwise stated in the history of present illness.  Patient Active Problem List   Diagnosis Date Noted   Nephrolithiasis 11/27/2023   Pregnancy 09/06/2019   Gestational diabetes mellitus 09/01/2019   Hypertension, essential 09/01/2019   Oligohydramnios antepartum, third trimester, fetus 1 08/28/2019    No current facility-administered medications on file prior to encounter.   Current Outpatient Medications on File Prior to Encounter  Medication Sig Dispense Refill   acetaminophen  (TYLENOL ) 500 MG tablet Take 500 mg by mouth every 6 (six) hours as needed for moderate pain (pain score 4-6).     NIFEdipine  (ADALAT  CC) 30 MG 24 hr tablet Take 30 mg by mouth daily.     Prenatal Vit-Fe Fumarate-FA (PRENATAL MULTIVITAMIN) TABS tablet Take 1 tablet by mouth daily at 12 noon.     promethazine (PHENERGAN) 12.5 MG tablet Take 12.5 mg by mouth every 6 (six) hours as needed for nausea or vomiting.     docusate sodium  (COLACE) 100 MG capsule Take 100 mg by mouth daily as needed for mild constipation.     ferrous sulfate  325 (65 FE) MG tablet Take 325 mg by mouth daily with breakfast.     ibuprofen  (ADVIL ) 600 MG tablet Take 1 tablet (600 mg total) by mouth every 6 (six) hours as needed. 60 tablet 0   labetalol  (NORMODYNE ) 200 MG tablet Take 200 mg by mouth 2 (two) times daily.     ondansetron   (ZOFRAN -ODT) 8 MG disintegrating tablet Take 1 tablet (8 mg total) by mouth every 8 (eight) hours as needed for nausea or vomiting. 20 tablet 0   oxyCODONE  (OXY IR/ROXICODONE ) 5 MG immediate release tablet Take 1 tablet (5 mg total) by mouth every 6 (six) hours as needed for severe pain. 12 tablet 0   oxyCODONE -acetaminophen  (PERCOCET/ROXICET) 5-325 MG tablet Take 1 tablet by mouth every 6 (six) hours as needed for severe pain. 15 tablet 0   tamsulosin  (FLOMAX ) 0.4 MG CAPS capsule Take 1 capsule (0.4 mg total) by mouth daily after supper. 30 capsule 0    Past Medical History:  Diagnosis Date   Gestational diabetes    Hypertension     Past Surgical History:  Procedure Laterality Date   CESAREAN SECTION N/A 09/06/2019   Procedure: CESAREAN SECTION;  Surgeon: Lenon Oneil BRAVO, MD;  Location: [facility] LD ORS;  Service: Obstetrics;  Laterality: N/A;   WISDOM TOOTH EXTRACTION      Social History   Tobacco Use   Smoking status: Never   Smokeless tobacco: Never  Vaping Use   Vaping status: Never Used  Substance Use Topics   Alcohol use: Never   Drug use: Never    Family History  Problem Relation Age of Onset   Diabetes Father    Lymphoma Maternal Grandmother     PE: Vitals:   11/27/23 1615 11/27/23 1616 11/27/23 2008 11/27/23 2330  BP:  125/75 ROLLEN)  144/73 135/68  Pulse:  63 (!) 58 69  Resp:   18 17  Temp:   98.7 F (37.1 C) 98.4 F (36.9 C)  TempSrc:   Oral Oral  SpO2: 96%   100%   Patient appears to be in no acute distress  patient is alert and oriented x3 Atraumatic normocephalic head No increased work of breathing, no audible wheezes/rhonchi Regular sinus rhythm/rate Abdomen is pregnant Lower extremities are symmetric without appreciable edema Grossly neurologically intact No identifiable skin lesions  Recent Labs    11/27/23 1542  WBC 10.7*  HGB 11.2*  HCT 32.5*   Recent Labs    11/27/23 1542  NA 133*  K 3.4*  CL 105  CO2 19*  GLUCOSE 85  BUN 8   CREATININE 0.66  CALCIUM  8.9   No results for input(s): LABPT, INR in the last 72 hours. No results for input(s): LABURIN in the last 72 hours. Results for orders placed or performed during the hospital encounter of 09/04/19  SARS Coronavirus 2 by RT PCR (hospital order, performed in [facility] hospital lab) Nasopharyngeal Nasopharyngeal Swab     Status: None   Collection Time: 09/04/19  4:52 PM   Specimen: Nasopharyngeal Swab  Result Value Ref Range Status   SARS Coronavirus 2 NEGATIVE NEGATIVE Final    Comment: (NOTE) If result is NEGATIVE SARS-CoV-2 target nucleic acids are NOT DETECTED. The SARS-CoV-2 RNA is generally detectable in upper and lower  respiratory specimens during the acute phase of infection. The lowest  concentration of SARS-CoV-2 viral copies this assay can detect is 250  copies / mL. A negative result does not preclude SARS-CoV-2 infection  and should not be used as the sole basis for treatment or other  patient management decisions.  A negative result may occur with  improper specimen collection / handling, submission of specimen other  than nasopharyngeal swab, presence of viral mutation(s) within the  areas targeted by this assay, and inadequate number of viral copies  (<250 copies / mL). A negative result must be combined with clinical  observations, patient history, and epidemiological information. If result is POSITIVE SARS-CoV-2 target nucleic acids are DETECTED. The SARS-CoV-2 RNA is generally detectable in upper and lower  respiratory specimens dur ing the acute phase of infection.  Positive  results are indicative of active infection with SARS-CoV-2.  Clinical  correlation with patient history and other diagnostic information is  necessary to determine patient infection status.  Positive results do  not rule out bacterial infection or co-infection with other viruses. If result is PRESUMPTIVE POSTIVE SARS-CoV-2 nucleic acids MAY BE PRESENT.    A presumptive positive result was obtained on the submitted specimen  and confirmed on repeat testing.  While 2019 novel coronavirus  (SARS-CoV-2) nucleic acids may be present in the submitted sample  additional confirmatory testing may be necessary for epidemiological  and / or clinical management purposes  to differentiate between  SARS-CoV-2 and other Sarbecovirus currently known to infect humans.  If clinically indicated additional testing with an alternate test  methodology [phone]) is advised. The SARS-CoV-2 RNA is generally  detectable in upper and lower respiratory sp ecimens during the acute  phase of infection. The expected result is Negative. Fact Sheet for Patients:  boilerbrush.com.cy Fact Sheet for Healthcare Providers: https://pope.com/ This test is not yet approved or cleared by the [place]  FDA and has been authorized for detection and/or diagnosis of SARS-CoV-2 by FDA under an Emergency Use Authorization (EUA).  This EUA  will remain in effect (meaning this test can be used) for the duration of the COVID-19 declaration under Section 564(b)(1) of the Act, 21 U.S.C. section 360bbb-3(b)(1), unless the authorization is terminated or revoked sooner. Performed at [facility], [address], [place], [place] [address]     Imaging: IMPRESSION: 1. Severe left hydroureteronephrosis secondary to a 8 x 8 mm left ureteral stone at the level of the left pelvic sidewall. 2. Tiny punctate stone within the right kidney without hydronephrosis. 3. Gravid uterus.     Electronically Signed   By: Mabel Converse D.O.   On: 11/27/2023 17:41  A/P: Obstructing left ureteral calculus with severe hydronephrosis -Discussed management options of the stone with patient and her family including medical expulsive trial, temporizing measure with either stent or nephrostomy tube, or definitive management ureteroscopy laser  lithotripsy and stent placement. -We discussed the risks and benefits of all of the above options and she has elected to proceed with left ureteroscopy with laser listers and stent placement.  Risks and benefits of the procedure were discussed with the patient including but not limited to pain, bleeding, infection, stent discomfort, inability to remove stone, need for additional procedures or staged procedures, ureteral stricture, damage to surrounding structures. -I discussed with patient that ideally urology cases like this are done at [facility] however there is not OB/GYN or NICU care there.  Based on how far along patient is in pregnancy we decided the safest thing for patient and her baby would be to schedule this at [facility]. -Surgery scheduled for 3 PM on 1/13.  N.p.o. tonight at midnight.   Thank you for involving me in this patient's care, I will continue to follow along.Please page with any further questions or concerns. Jurnie Garritano D Shaneque Merkle

## 2023-11-28 NOTE — Plan of Care (Signed)
  Problem: Education: Goal: Knowledge of disease or condition will improve Outcome: Progressing   Problem: Education: Goal: Knowledge of the prescribed therapeutic regimen will improve Outcome: Progressing   Problem: Education: Goal: Individualized Educational Video(s) Outcome: Progressing   Problem: Clinical Measurements: Goal: Complications related to the disease process, condition or treatment will be avoided or minimized Outcome: Progressing   Problem: Education: Goal: Knowledge of General Education information will improve Description: Including pain rating scale, medication(s)/side effects and non-pharmacologic comfort measures Outcome: Progressing   Problem: Health Behavior/Discharge Planning: Goal: Ability to manage health-related needs will improve Outcome: Progressing

## 2023-11-28 NOTE — Progress Notes (Signed)
 Patient ID: Anna Nelson, female   DOB: 11-02-1986, 38 y.o.   MRN: [id number]  S: Overall pain controlled currently O:  Vitals:   11/28/23 1057 11/28/23 1106 11/28/23 1200 11/28/23 1300  BP: 118/73 125/88  127/75  Pulse: 66 73  65  Resp: 20 13    Temp:      TempSrc:      SpO2: 98% 98% 96% 95%   Alert and orient x 3, no apparent distress Gravid, soft Results for orders placed or performed during the hospital encounter of 11/27/23 (from the past 24 hours)  Urinalysis, Routine w reflex microscopic -Urine, Clean Catch     Status: Abnormal   Collection Time: 11/27/23  3:42 PM  Result Value Ref Range   Color, Urine YELLOW YELLOW   APPearance CLEAR CLEAR   Specific Gravity, Urine 1.006 1.005 - 1.030   pH 8.0 5.0 - 8.0   Glucose, UA NEGATIVE NEGATIVE mg/dL   Hgb urine dipstick LARGE (A) NEGATIVE   Bilirubin Urine NEGATIVE NEGATIVE   Ketones, ur 5 (A) NEGATIVE mg/dL   Protein, ur NEGATIVE NEGATIVE mg/dL   Nitrite NEGATIVE NEGATIVE   Leukocytes,Ua TRACE (A) NEGATIVE   RBC / HPF 6-10 0 - 5 RBC/hpf   WBC, UA 0-5 0 - 5 WBC/hpf   Bacteria, UA RARE (A) NONE SEEN   Squamous Epithelial / HPF 6-10 0 - 5 /HPF   Mucus PRESENT   CBC     Status: Abnormal   Collection Time: 11/27/23  3:42 PM  Result Value Ref Range   WBC 10.7 (H) 4.0 - 10.5 K/uL   RBC 3.56 (L) 3.87 - 5.11 MIL/uL   Hemoglobin 11.2 (L) 12.0 - 15.0 g/dL   HCT 67.4 (L) 63.9 - 53.9 %   MCV 91.3 80.0 - 100.0 fL   MCH 31.5 26.0 - 34.0 pg   MCHC 34.5 30.0 - 36.0 g/dL   RDW 87.1 88.4 - 84.4 %   Platelets 282 150 - 400 K/uL   nRBC 0.0 0.0 - 0.2 %  Comprehensive metabolic panel     Status: Abnormal   Collection Time: 11/27/23  3:42 PM  Result Value Ref Range   Sodium 133 (L) 135 - 145 mmol/L   Potassium 3.4 (L) 3.5 - 5.1 mmol/L   Chloride 105 98 - 111 mmol/L   CO2 19 (L) 22 - 32 mmol/L   Glucose, Bld 85 70 - 99 mg/dL   BUN 8 6 - 20 mg/dL   Creatinine, Ser 9.33 0.44 - 1.00 mg/dL   Calcium  8.9 8.9 - 10.3 mg/dL   Total  Protein 6.9 6.5 - 8.1 g/dL   Albumin 2.9 (L) 3.5 - 5.0 g/dL   AST 18 15 - 41 U/L   ALT 15 0 - 44 U/L   Alkaline Phosphatase 46 38 - 126 U/L   Total Bilirubin 0.6 0.0 - 1.2 mg/dL   GFR, Estimated >39 >39 mL/min   Anion gap 9 5 - 15  Type and screen [facility]     Status: None   Collection Time: 11/27/23  3:55 PM  Result Value Ref Range   ABO/RH(D) A POS    Antibody Screen NEG    Sample Expiration      11/30/2023,2359 Performed at [facility], [address] [place], [place] [address]    Assessment and plan: 38 year old G3 P1-0-1-1 at 23+3 with left nephrolithiasis  1) continue inpatient hospitalization 2) continue pain management with Dilaudid  PCA 3) severe left  hydronephrosis with 8 x 8 mm ureteral stone at level of left pelvic sidewall.  Status post urology consult.  Appreciate their assistance.  Patient is currently scheduled for left ureteroscopy on Monday 1/13 at 3 PM.

## 2023-11-29 ENCOUNTER — Inpatient Hospital Stay (HOSPITAL_COMMUNITY): Payer: Medicaid Other | Admitting: Anesthesiology

## 2023-11-29 ENCOUNTER — Inpatient Hospital Stay (HOSPITAL_COMMUNITY): Payer: Medicaid Other

## 2023-11-29 ENCOUNTER — Encounter (HOSPITAL_COMMUNITY): Payer: Self-pay | Admitting: Obstetrics and Gynecology

## 2023-11-29 ENCOUNTER — Other Ambulatory Visit: Payer: Self-pay

## 2023-11-29 ENCOUNTER — Encounter (HOSPITAL_COMMUNITY)
Admission: AD | Disposition: A | Discharge status: Home / Self Care | Payer: Self-pay | Source: Home / Self Care | Attending: Obstetrics and Gynecology

## 2023-11-29 DIAGNOSIS — N200 Calculus of kidney: Secondary | ICD-10-CM

## 2023-11-29 HISTORY — PX: CYSTOSCOPY WITH RETROGRADE PYELOGRAM, URETEROSCOPY AND STENT PLACEMENT: SHX5789

## 2023-11-29 SURGERY — CYSTOURETEROSCOPY, WITH RETROGRADE PYELOGRAM AND STENT INSERTION
Anesthesia: General | Laterality: Left

## 2023-11-29 MED ORDER — PROPOFOL 500 MG/50ML IV EMUL
INTRAVENOUS | Status: DC | PRN
  Administered 2023-11-29: 250 ug/kg/min via INTRAVENOUS

## 2023-11-29 MED ORDER — ORAL CARE MOUTH RINSE: 15.0000 mL | Freq: Once | OROMUCOSAL | Status: AC

## 2023-11-29 MED ORDER — DROPERIDOL 2.5 MG/ML IJ SOLN: 0.6250 mg | Freq: Once | INTRAMUSCULAR | Status: DC | PRN

## 2023-11-29 MED ORDER — SODIUM CHLORIDE 0.9 % IV SOLN: INTRAVENOUS | Status: DC | PRN

## 2023-11-29 MED ORDER — DEXAMETHASONE SODIUM PHOSPHATE 10 MG/ML IJ SOLN
INTRAMUSCULAR | Status: DC | PRN
  Administered 2023-11-29: 10 mg via INTRAVENOUS

## 2023-11-29 MED ORDER — PROPOFOL 10 MG/ML IV BOLUS
INTRAVENOUS | Status: DC | PRN
  Administered 2023-11-29: 50 mg via INTRAVENOUS
  Administered 2023-11-29: 150 mg via INTRAVENOUS

## 2023-11-29 MED ORDER — CEFAZOLIN SODIUM-DEXTROSE 2-3 GM-%(50ML) IV SOLR
INTRAVENOUS | Status: DC | PRN
  Administered 2023-11-29: 2 g via INTRAVENOUS

## 2023-11-29 MED ORDER — SUCCINYLCHOLINE CHLORIDE 200 MG/10ML IV SOSY
PREFILLED_SYRINGE | INTRAVENOUS | Status: AC
  Filled 2023-11-29: qty 10

## 2023-11-29 MED ORDER — ACETAMINOPHEN-CAFFEINE 500-65 MG PO TABS
2.0000 | ORAL_TABLET | Freq: Four times a day (QID) | ORAL | Status: DC | PRN
  Filled 2023-11-29: qty 2

## 2023-11-29 MED ORDER — SUCCINYLCHOLINE CHLORIDE 200 MG/10ML IV SOSY
PREFILLED_SYRINGE | INTRAVENOUS | Status: DC | PRN
  Administered 2023-11-29: 80 mg via INTRAVENOUS

## 2023-11-29 MED ORDER — CHLORHEXIDINE GLUCONATE 0.12 % MT SOLN
15.0000 mL | Freq: Once | OROMUCOSAL | Status: AC
Start: 2023-11-29 — End: 2023-11-29

## 2023-11-29 MED ORDER — PHENYLEPHRINE 80 MCG/ML (10ML) SYRINGE FOR IV PUSH (FOR BLOOD PRESSURE SUPPORT)
PREFILLED_SYRINGE | INTRAVENOUS | Status: DC | PRN
  Administered 2023-11-29: 80 ug via INTRAVENOUS

## 2023-11-29 MED ORDER — FENTANYL CITRATE (PF) 250 MCG/5ML IJ SOLN
INTRAMUSCULAR | Status: DC | PRN
  Administered 2023-11-29: 100 ug via INTRAVENOUS

## 2023-11-29 MED ORDER — PROPOFOL 1000 MG/100ML IV EMUL
INTRAVENOUS | Status: AC
Start: 2023-11-29 — End: ?
  Filled 2023-11-29: qty 100

## 2023-11-29 MED ORDER — FENTANYL CITRATE (PF) 250 MCG/5ML IJ SOLN
INTRAMUSCULAR | Status: AC
  Filled 2023-11-29: qty 5

## 2023-11-29 MED ORDER — LACTATED RINGERS IV SOLN
INTRAVENOUS | Status: DC
Start: 2023-11-29 — End: 2023-11-29

## 2023-11-29 MED ORDER — CHLORHEXIDINE GLUCONATE 0.12 % MT SOLN
OROMUCOSAL | Status: AC
  Administered 2023-11-29: 15 mL via OROMUCOSAL
  Filled 2023-11-29: qty 15

## 2023-11-29 MED ORDER — WATER FOR IRRIGATION, STERILE IR SOLN
Status: DC | PRN
  Administered 2023-11-29: 1000 mL

## 2023-11-29 MED ORDER — FENTANYL CITRATE (PF) 100 MCG/2ML IJ SOLN: 25.0000 ug | INTRAMUSCULAR | Status: DC | PRN

## 2023-11-29 MED ORDER — 0.9 % SODIUM CHLORIDE (POUR BTL) OPTIME
TOPICAL | Status: DC | PRN
  Administered 2023-11-29: 1000 mL

## 2023-11-29 MED ORDER — ONDANSETRON HCL 4 MG/2ML IJ SOLN
INTRAMUSCULAR | Status: DC | PRN
  Administered 2023-11-29: 4 mg via INTRAVENOUS

## 2023-11-29 MED ORDER — PHENYLEPHRINE HCL-NACL 20-0.9 MG/250ML-% IV SOLN
INTRAVENOUS | Status: DC | PRN
  Administered 2023-11-29: 20 ug/min via INTRAVENOUS

## 2023-11-29 MED ORDER — CEFAZOLIN SODIUM-DEXTROSE 2-4 GM/100ML-% IV SOLN
2.0000 g | Freq: Once | INTRAVENOUS | Status: DC
  Filled 2023-11-29: qty 100

## 2023-11-29 MED ORDER — SODIUM CHLORIDE 0.9 % IR SOLN
Status: DC | PRN
  Administered 2023-11-29 (×2): 3000 mL

## 2023-11-29 MED ORDER — IOHEXOL 300 MG/ML  SOLN
INTRAMUSCULAR | Status: DC | PRN
  Administered 2023-11-29: 6 mL

## 2023-11-29 MED ORDER — LACTATED RINGERS IV SOLN
INTRAVENOUS | Status: AC
Start: 2023-11-29 — End: 2023-11-30

## 2023-11-29 SURGICAL SUPPLY — 22 items
BAG URINE DRAIN 2000ML AR STRL (UROLOGICAL SUPPLIES) ×2 IMPLANT
BAG URO CATCHER STRL LF (MISCELLANEOUS) ×2 IMPLANT
CATH FOLEY 2WAY SLVR 5CC 16FR (CATHETERS) IMPLANT
CATH URET 5FR 28IN OPEN ENDED (CATHETERS) ×2 IMPLANT
CATH URETL OPEN 5X70 (CATHETERS) ×2 IMPLANT
GLOVE BIO SURGEON STRL SZ 6.5 (GLOVE) ×2 IMPLANT
GOWN STRL REUS W/ TWL LRG LVL3 (GOWN DISPOSABLE) ×2 IMPLANT
GOWN STRL REUS W/ TWL XL LVL3 (GOWN DISPOSABLE) ×2 IMPLANT
GUIDEWIRE ANG ZIPWIRE 038X150 (WIRE) IMPLANT
GUIDEWIRE STR DUAL SENSOR (WIRE) ×3 IMPLANT
KIT TURNOVER KIT B (KITS) ×2 IMPLANT
MANIFOLD NEPTUNE II (INSTRUMENTS) IMPLANT
NS IRRIG 1000ML POUR BTL (IV SOLUTION) IMPLANT
PACK CYSTO (CUSTOM PROCEDURE TRAY) ×2 IMPLANT
SHEATH NAVIGATOR HD 11/13X28 (SHEATH) ×1 IMPLANT
STENT URET 6FRX24 CONTOUR (STENTS) IMPLANT
STENT URET 6FRX26 CONTOUR (STENTS) IMPLANT
SYPHON OMNI JUG (MISCELLANEOUS) ×1 IMPLANT
TOWEL GREEN STERILE FF (TOWEL DISPOSABLE) ×2 IMPLANT
TRACTIP FLEXIVA PULSE ID 200 (Laser) ×1 IMPLANT
TUBE CONNECTING 12X1/4 (SUCTIONS) IMPLANT
WATER STERILE IRR 3000ML UROMA (IV SOLUTION) ×2 IMPLANT

## 2023-11-29 NOTE — Progress Notes (Signed)
     Subjective: Anna Nelson. First time meeting pt this morning. She was very pleasant and pain was well controled.   Objective: Vital signs in last 24 hours: Temp:  [97.7 F (36.5 C)-98.9 F (37.2 C)] 97.7 F (36.5 C) (01/13 0802) Pulse Rate:  [56-77] 56 (01/13 0802) Resp:  [11-20] 20 (01/13 1018) BP: (114-128)/(59-81) 114/63 (01/13 0802) SpO2:  [95 %-99 %] 97 % (01/12 1830)  Assessment/Plan: #left ureteral stone #severe left hydronephrosis  CT A/P on 11/27/23 notes 8mm left ureteral stone with associated hydronephrosis Scheduled for ureteroscopy with laser lithotripsy with Dr. Elisabeth at 3p on 11/29/23. Pain is well managed, at a 3 today. Continue Dilauded PCA, flomax , and PRN zofran .  Intake/Output from previous day: 01/12 0701 - 01/13 0700 In: 2757.5 [P.O.:1080; I.V.:1677.5] Out: 4450 [Urine:4450]  Intake/Output this shift: Total I/O In: 0  Out: 650 [Urine:650]  Physical Exam:  General: Alert and oriented CV: No cyanosis Lungs: equal chest rise   Lab Results: Recent Labs    11/27/23 1542  HGB 11.2*  HCT 32.5*   BMET Recent Labs    11/27/23 1542  NA 133*  K 3.4*  CL 105  CO2 19*  GLUCOSE 85  BUN 8  CREATININE 0.66  CALCIUM  8.9     Studies/Results: CT RENAL STONE STUDY Result Date: 11/27/2023 CLINICAL DATA:  Left flank pain. Patient is approximately [redacted] weeks pregnant EXAM: CT ABDOMEN AND PELVIS WITHOUT CONTRAST TECHNIQUE: Multidetector CT imaging of the abdomen and pelvis was performed following the standard protocol without IV contrast. RADIATION DOSE REDUCTION: This exam was performed according to the departmental dose-optimization program which includes automated exposure control, adjustment of the mA and/or kV according to patient size and/or use of iterative reconstruction technique. COMPARISON:  CT 11/28/2022 FINDINGS: Lower chest: No acute abnormality. Hepatobiliary: Liver not fully imaged within the field of view. Unremarkable unenhanced appearance of  the included liver. No focal liver lesion identified. Gallbladder is moderately distended but appears otherwise unremarkable. No hyperdense gallstone. No biliary dilatation. Pancreas: Unremarkable. No pancreatic ductal dilatation or surrounding inflammatory changes. Spleen: Spleen not fully imaged within the field of view. Normal appearance of the imaged spleen. Adrenals/Urinary Tract: Severe left hydroureteronephrosis secondary to a 8 x 8 mm left ureteral stone at the level of the left pelvic sidewall (series 3, image 54). No stones within the left kidney. Tiny punctate stone within the right kidney without hydronephrosis. Urinary bladder within normal limits. Stomach/Bowel: Stomach is within normal limits. Appendix not visualized, likely surgically absent. No evidence of bowel wall thickening, distention, or inflammatory changes. Vascular/Lymphatic: No significant vascular findings are present. No enlarged abdominal or pelvic lymph nodes. Reproductive: Gravid uterus containing fetus. Transverse fetal lie, head maternal left. No adnexal masses. Other: No free fluid. No abdominopelvic fluid collection. No pneumoperitoneum. Musculoskeletal: No acute or significant osseous findings. IMPRESSION: 1. Severe left hydroureteronephrosis secondary to a 8 x 8 mm left ureteral stone at the level of the left pelvic sidewall. 2. Tiny punctate stone within the right kidney without hydronephrosis. 3. Gravid uterus. Electronically Signed   By: Mabel Converse D.O.   On: 11/27/2023 17:41      LOS: 2 days   Ole Bourdon, NP [facility] Pager: [phone]  11/29/2023, 11:34 AM

## 2023-11-29 NOTE — Transfer of Care (Signed)
 Immediate Anesthesia Transfer of Care Note  Patient: Manasi Wurtzel  Procedure(s) Performed: DIAGNOSTIC URETEROSCOPY (Left)  Patient Location: PACU  Anesthesia Type:General  Level of Consciousness: awake, alert , and oriented  Airway & Oxygen Therapy: Patient Spontanous Breathing  Post-op Assessment: Report given to RN, Post -op Vital signs reviewed and stable, and Patient moving all extremities  Post vital signs: Reviewed and stable  Last Vitals:  Vitals Value Taken Time  BP 131/85 11/29/23 1645  Temp    Pulse 73 11/29/23 1646  Resp 17 11/29/23 1646  SpO2 92 % 11/29/23 1646  Vitals shown include unfiled device data.  Last Pain:  Vitals:   11/29/23 1541  TempSrc:   PainSc: 0-No pain         Complications: No notable events documented.

## 2023-11-29 NOTE — Op Note (Addendum)
 Preoperative diagnosis: left ureteral calculus  Postoperative diagnosis: No stone visualized  Procedure:  Cystoscopy left diagnostic ureteroscopy left retrograde pyelography with interpretation  Surgeon: Valli Shank, MD  Anesthesia: General  Complications: None  Intraoperative findings:  Normal urethra Bilateral orthotropic ureteral orifices left retrograde pyelography demonstrated hydronephrosis to the level of bladder without filling defect Bladder mucosa normal without masses   EBL: Minimal  Specimens:None  Disposition of specimens: [facility] for stone analysis  Indication: Anna Nelson is a 38 y.o.   pregnant woman currently 23 weeks and 4 days with a 8 mm obstructing left ureteral calculus with severe left hydronephrosis and  requirement for IV pain meds.  After reviewing the management options for treatment, the patient elected to proceed with the above surgical procedure(s). We have discussed the potential benefits and risks of the procedure, side effects of the proposed treatment, the likelihood of the patient achieving the goals of the procedure, and any potential problems that might occur during the procedure or recuperation. Informed consent has been obtained.   Description of procedure:  The patient was taken to the operating room and general anesthesia was induced.  The patient was placed in the dorsal lithotomy position, prepped and draped in the usual sterile fashion, and preoperative antibiotics were administered. A preoperative time-out was performed.   Cystourethroscopy was performed.  The patient's urethra was examined and was normal.  The bladder was then systematically examined in its entirety. There was no evidence for any bladder tumors, stones, or other mucosal pathology.    Attention then turned to the left ureteral orifice and a ureteral catheter was used to intubate the ureteral orifice.  Omnipaque  contrast was injected through  the ureteral catheter and a retrograde pyelogram was performed with findings as dictated above.  A 0.38 sensor guidewire was then advanced up the left ureter into the renal pelvis under fluoroscopic guidance.  A semirigid ureteroscope was advanced alongside the wire up to the UPJ.  No stone was seen in the ureter.  A second sensor is placed through the ureteroscope and advanced the kidney under direct visualization.  A ureteral access sheath was placed over one of the wires and the second wire secured as a safety wire.  Flexible ureteroscopy then took place.  Pyeloscopy took place and there is no stone seen in the upper, mid or lower pole.  The ureteroscope was then removed in unison with the access sheath taking care examine the ureter on the way out.  The ureter was noted to be wide open without trauma or injury.  Note that fluoroscopy was limited and used sparingly only for confirmation of location/safety due to patient being pregnant.  The decision was made not to leave a stent as there is no narrowing of the ureter and no resistance with placement of the access sheath.  The bladder was then emptied and the procedure ended.  The patient appeared to tolerate the procedure well and without complications.  The patient was able to be awakened and transferred to the recovery unit in satisfactory condition.   Disposition: Patient emerged from anesthesia and transferred the PACU in stable condition.  Fetal heart tones will be performed on the baby when she arrives in PACU. Patient either passed stone or it was pushed into a calyx that was not visible.

## 2023-11-29 NOTE — Progress Notes (Signed)
 Fhr 130 BPM by doppler. No vaginal bleeding or leaking of fluid.

## 2023-11-29 NOTE — Interval H&P Note (Signed)
 History and Physical Interval Note:  11/29/2023 2:37 PM  Anna Nelson  has presented today for surgery, with the diagnosis of KIDNEY STONE.  The various methods of treatment have been discussed with the patient and family. After consideration of risks, benefits and other options for treatment, the patient has consented to  Procedure(s): CYSTOSCOPY WITH RETROGRADE PYELOGRAM, URETEROSCOPY AND STENT PLACEMENT (Left) CYSTOSCOPY/URETEROSCOPY/HOLMIUM LASER/STENT PLACEMENT (N/A) as a surgical intervention.  The patient's history has been reviewed, patient examined, no change in status, stable for surgery.  I have reviewed the patient's chart and labs.  Questions were answered to the patient's satisfaction.     Seini Lannom D Rakeen Gaillard

## 2023-11-29 NOTE — Anesthesia Preprocedure Evaluation (Addendum)
 Anesthesia Evaluation  Patient identified by MRN, date of birth, ID band Patient awake    Reviewed: Allergy & Precautions, NPO status , Patient's Chart, lab work & pertinent test results, reviewed documented beta blocker date and time   Airway Mallampati: II  TM Distance: >3 FB Neck ROM: Full    Dental no notable dental hx. (+) Dental Advisory Given, Teeth Intact   Pulmonary neg pulmonary ROS   Pulmonary exam normal breath sounds clear to auscultation       Cardiovascular hypertension, Pt. on medications and Pt. on home beta blockers  Rhythm:Regular Rate:Normal     Neuro/Psych negative neurological ROS  negative psych ROS   GI/Hepatic negative GI ROS, Neg liver ROS,,,  Endo/Other  diabetes, Gestational    Renal/GU negative Renal ROS     Musculoskeletal negative musculoskeletal ROS (+)    Abdominal   Peds  Hematology negative hematology ROS (+)   Anesthesia Other Findings   Reproductive/Obstetrics (+) Pregnancy                             Anesthesia Physical Anesthesia Plan  ASA: 2 and emergent  Anesthesia Plan: General   Post-op Pain Management: Ofirmev  IV (intra-op)*   Induction: Intravenous and Rapid sequence  PONV Risk Score and Plan: 4 or greater and Treatment may vary due to age or medical condition, Ondansetron , Dexamethasone , Propofol  infusion and TIVA  Airway Management Planned: Oral ETT  Additional Equipment:   Intra-op Plan:   Post-operative Plan: Extubation in OR  Informed Consent: I have reviewed the patients History and Physical, chart, labs and discussed the procedure including the risks, benefits and alternatives for the proposed anesthesia with the patient or authorized representative who has indicated his/her understanding and acceptance.     Dental advisory given  Plan Discussed with: CRNA  Anesthesia Plan Comments: (Risks of anesthesia explained at  length. This includes, but is not limited to, sore throat, damage to teeth, lips gums, tongue and vocal cords, nausea and vomiting, reactions to medications, stroke, heart attack, and death. All patient questions were answered and the patient wishes to proceed.  [redacted] wks pregnant as well. Discussed risks of anesthesia to the baby.)        Anesthesia Quick Evaluation

## 2023-11-29 NOTE — Progress Notes (Signed)
 Patient ID: Anna Nelson, female   DOB: June 05, 1986, 38 y.o.   MRN: [id number]  S: Overall pain controlled currently with dilaudid  PCA.  C/o headache overnight, but resolved spontaneously.  Good FM, no cramping or contractions O:  Vitals:   11/29/23 0524 11/29/23 0600 11/29/23 0802 11/29/23 1018  BP:   114/63   Pulse:   (!) 56   Resp:  16 16 20   Temp:   97.7 F (36.5 C)   TempSrc:   Oral   SpO2:      Height: 5' (1.524 m)      Alert and orient x 3, no apparent distress Gravid, soft   Assessment and plan: 38 year old G58P1011 @ [redacted]w[redacted]d admitted with left nephrolithiasis  Pregnancy incidental to admission Obstructing left nephrolithiasis with severe left hydronephrosis.  Continue pain management with Dilaudid  PCA.  S/p urology consultation with planned cystoscopy, left ureteroscopy and stent placement today at 1500.  Is currently NPO on IVF.   Planned dispo following surgery per urology recommendations Given gestational age, plan fetal heart tones prior to surgery and in PACU.  Rapid response OB is aware of patient and should be alerted by PACU nurse once patient arrives to allow assessment of post op fetal heart tones

## 2023-11-29 NOTE — Anesthesia Procedure Notes (Signed)
 Procedure Name: Intubation Date/Time: 11/29/2023 4:05 PM  Performed by: Zelphia Norleen HERO, CRNAPre-anesthesia Checklist: Patient identified, Emergency Drugs available, Suction available and Patient being monitored Patient Re-evaluated:Patient Re-evaluated prior to induction Oxygen Delivery Method: Circle system utilized Preoxygenation: Pre-oxygenation with 100% oxygen Induction Type: IV induction Ventilation: Mask ventilation without difficulty Laryngoscope Size: Mac and 3 Grade View: Grade II Tube type: Oral Tube size: 6.5 mm Number of attempts: 2 Airway Equipment and Method: Stylet and Oral airway Placement Confirmation: ETT inserted through vocal cords under direct vision, positive ETCO2 and breath sounds checked- equal and bilateral Secured at: 21 cm Tube secured with: Tape Dental Injury: Teeth and Oropharynx as per pre-operative assessment

## 2023-11-30 ENCOUNTER — Encounter (HOSPITAL_COMMUNITY): Payer: Self-pay | Admitting: Urology

## 2023-11-30 MED ORDER — HYDROMORPHONE HCL 2 MG PO TABS: 2.0000 mg | ORAL_TABLET | Freq: Four times a day (QID) | ORAL | 0 refills | Status: AC | PRN

## 2023-11-30 MED ORDER — LACTATED RINGERS IV SOLN: INTRAVENOUS | Status: DC

## 2023-11-30 NOTE — Plan of Care (Signed)
  Problem: Education: Goal: Knowledge of disease or condition will improve Outcome: Completed/Met Goal: Knowledge of the prescribed therapeutic regimen will improve Outcome: Completed/Met Goal: Individualized Educational Video(s) Outcome: Completed/Met   Problem: Clinical Measurements: Goal: Complications related to the disease process, condition or treatment will be avoided or minimized Outcome: Completed/Met   Problem: Education: Goal: Knowledge of General Education information will improve Description: Including pain rating scale, medication(s)/side effects and non-pharmacologic comfort measures Outcome: Completed/Met   Problem: Health Behavior/Discharge Planning: Goal: Ability to manage health-related needs will improve Outcome: Completed/Met   Problem: Clinical Measurements: Goal: Ability to maintain clinical measurements within normal limits will improve Outcome: Completed/Met Goal: Will remain free from infection Outcome: Completed/Met Goal: Diagnostic test results will improve Outcome: Completed/Met Goal: Respiratory complications will improve Outcome: Completed/Met Goal: Cardiovascular complication will be avoided Outcome: Completed/Met   Problem: Activity: Goal: Risk for activity intolerance will decrease Outcome: Completed/Met   Problem: Nutrition: Goal: Adequate nutrition will be maintained Outcome: Completed/Met   Problem: Coping: Goal: Level of anxiety will decrease Outcome: Completed/Met   Problem: Elimination: Goal: Will not experience complications related to bowel motility Outcome: Completed/Met Goal: Will not experience complications related to urinary retention Outcome: Completed/Met   Problem: Pain Management: Goal: General experience of comfort will improve Outcome: Completed/Met   Problem: Safety: Goal: Ability to remain free from injury will improve Outcome: Completed/Met   Problem: Skin Integrity: Goal: Risk for impaired skin  integrity will decrease Outcome: Completed/Met

## 2023-11-30 NOTE — Progress Notes (Addendum)
 Patient ID: Anna Nelson, female   DOB: March 04, 1986, 38 y.o.   MRN: [id number] Pt seen by urology and cleared for discharge with plan for outpt follow up postpartum While changing pt found a large nephrolith in her clothes. Strong likelihood this is the missing stone as she reports significant improvement in voiding.   Pt satisfied with going home today in light of passing stone and for close outpt follow up   She has a prescription for flomax  given by her primary urologist for emergent use.

## 2023-11-30 NOTE — Progress Notes (Addendum)
 HD#4; POD#0 Pt reports pain at a 3.5/10 ( vs 7/10 on arrival). Sore from procedure yesterday. She is due for pain medication now. She denies fever, chills, contractions or bleeding. She reports good appetite, voiding with no pain but small output, +Fms.  She has some questions about long term care plan for nephrolith given surgical findings  Temp:  [97.8 F (36.6 C)-99.1 F (37.3 C)] 99.1 F (37.3 C) (01/14 0821) Pulse Rate:  [56-86] 74 (01/14 0821) Resp:  [11-21] 16 (01/14 0821) BP: (122-151)/(70-89) 140/79 (01/14 0821) SpO2:  [91 %-99 %] 99 % (01/14 0821) Weight:  [72.6 kg] 72.6 kg (01/13 1541)   GEN - NAD ABD - soft, cs GA, nontender EXT - no edema   FHTs - 121  10.7>11.2<282 Cr 0.66, BUN - 8  A/P: 37yo G3P1011 female at 21 5/[redacted]wks ga on HD#4 with pain from nephrolith, POD#1 from cystoscopy, left diagnostic ureteroscopy and left retrograde pyelography with interpretation - pain moderately well controlled with oral meds - afebrile - reassuring fetal status - continue with BID FHTs - restart ivf : LR @ 154ml/hr  - watch BP; suspect elevated from pain. Nl LFTs and platelets - Await recommendations from urology re: care mgmt  - discharge home once stable

## 2023-11-30 NOTE — Discharge Instructions (Addendum)
 Call office with any concerns [phone]

## 2023-11-30 NOTE — Progress Notes (Addendum)
   1 Day Post-Op Subjective: NAEON.  Patient reports significant improvement in pain, though not complete resolution.  Preserved renal function.  No stone material has been collected in the strainer.  Objective: Vital signs in last 24 hours: Temp:  [97.8 F (36.6 C)-99.1 F (37.3 C)] 99.1 F (37.3 C) (01/14 0821) Pulse Rate:  [56-86] 74 (01/14 0821) Resp:  [11-21] 16 (01/14 0821) BP: (122-151)/(70-89) 140/79 (01/14 0821) SpO2:  [91 %-99 %] 99 % (01/14 0821) Weight:  [72.6 kg] 72.6 kg (01/13 1541)  Assessment/Plan: #left ureteral stone #severe left hydronephrosis  CT A/P on 11/27/23 notes 8mm left ureteral stone with associated hydronephrosis. Ureteroscopy with Dr. Elisabeth at 3p on 11/29/23.  After extensive searching, stone could not be visualized and has been presumed to have passed.  Preserved renal function.   Patient discussed confirmatory imaging to see if stone was still present but I would not advise additional radiation exposure while pregnant, while there is still a small chance that the stone may have tucked away somewhere, Dr. Gabe ureteroscopy was comprehensive.   Pt to contact office and schedule follow up with Dr. Elisabeth and Renal US  after she has delivered.   [facility] [address] [place], [place]  [address] [phone]   Okay to discharge from urologic perspective.    Intake/Output from previous day: 01/13 0701 - 01/14 0700 In: 1846.2 [I.V.:1846.2] Out: 1755 [Urine:1750; Blood:5]  Intake/Output this shift: Total I/O In: -  Out: 400 [Urine:400]  Physical Exam:  General: Alert and oriented CV: No cyanosis Lungs: equal chest rise   Lab Results: Recent Labs    11/27/23 1542  HGB 11.2*  HCT 32.5*   BMET Recent Labs    11/27/23 1542  NA 133*  K 3.4*  CL 105  CO2 19*  GLUCOSE 85  BUN 8  CREATININE 0.66  CALCIUM  8.9     Studies/Results: DG C-Arm 1-60 Min Result Date: 11/29/2023 CLINICAL DATA:   Surgery.  Diagnostic ureteroscopy. EXAM: DG C-ARM 1-60 MIN FLUOROSCOPY: Fluoroscopy Time:  53 seconds Radiation Exposure Index (if provided by the fluoroscopic device): 13.09 mGy Number of Acquired Spot Images: 4 COMPARISON:  Abdominal radiographs 12/23/2022 FINDINGS: Intraoperative fluoroscopy is obtained for surgical control purposes during cystoscopic procedure. Spot fluoroscopic images obtained demonstrate cannulation and contrast injection into the left intrarenal collecting system. IMPRESSION: Intraoperative fluoroscopy utilized for surgical control purposes during cystoscopic procedure. Electronically Signed   By: Elsie Gravely M.D.   On: 11/29/2023 20:39      LOS: 3 days   Ole Bourdon, NP [facility] Pager: [phone]  11/30/2023, 12:26 PM

## 2023-11-30 NOTE — Discharge Summary (Signed)
 Physician Discharge Summary  Patient ID: Anna Nelson MRN: [id number] DOB/AGE: 12-16-1985 38 y.o.  Admit date: 11/27/2023 Discharge date: 11/30/2023  Admission Diagnoses:  Discharge Diagnoses:  Principal Problem:   Nephrolithiasis   Discharged Condition: stable  Hospital Course: Pt was admitted for pain management from nephrolithiasis. She underwent diagnostic urethroscopy and yesterday however no stone noted. Pt later found stone in her clothes today. She reports significant improvement of pain and is voiding well. Feels stable for discharge to home today with plan for follow up with OBGYN in 1 week and with urology postpartum   Consults: urology  Significant Diagnostic Studies: urethroscopy   Treatments: IV hydration, analgesia: Dilaudid , and procedures: urethroscopy and cystoscopy   Discharge Exam: Blood pressure 118/62, pulse 62, temperature 98.6 F (37 C), temperature source Oral, resp. rate 18, height 5' 1 (1.549 m), weight 72.6 kg, last menstrual period 06/17/2023, SpO2 100%, unknown if currently breastfeeding. General appearance: alert, cooperative, and no distress ABD - soft, ND, cw GA   Disposition: Discharge disposition: 01-Home or Self Care       Discharge Instructions     Call MD for:  persistant nausea and vomiting   Complete by: As directed    Call MD for:  severe uncontrolled pain   Complete by: As directed    Call MD for:  temperature >100.4   Complete by: As directed    Diet - low sodium heart healthy   Complete by: As directed    Discharge instructions   Complete by: As directed    Call office with any concerns [phone]   Increase activity slowly   Complete by: As directed       Allergies as of 11/30/2023   No Known Allergies      Medication List     STOP taking these medications    ibuprofen  600 MG tablet Commonly known as: ADVIL    oxyCODONE  5 MG immediate release tablet Commonly known as: Oxy IR/ROXICODONE     oxyCODONE -acetaminophen  5-325 MG tablet Commonly known as: PERCOCET/ROXICET       TAKE these medications    acetaminophen  500 MG tablet Commonly known as: TYLENOL  Take 500 mg by mouth every 6 (six) hours as needed for moderate pain (pain score 4-6).   docusate sodium  100 MG capsule Commonly known as: COLACE Take 100 mg by mouth daily as needed for mild constipation.   ferrous sulfate  325 (65 FE) MG tablet Take 325 mg by mouth daily with breakfast.   HYDROmorphone  2 MG tablet Commonly known as: Dilaudid  Take 1 tablet (2 mg total) by mouth every 6 (six) hours as needed for up to 3 days for severe pain (pain score 7-10).   labetalol  200 MG tablet Commonly known as: NORMODYNE  Take 200 mg by mouth 2 (two) times daily.   NIFEdipine  30 MG 24 hr tablet Commonly known as: ADALAT  CC Take 30 mg by mouth daily.   ondansetron  8 MG disintegrating tablet Commonly known as: ZOFRAN -ODT Take 1 tablet (8 mg total) by mouth every 8 (eight) hours as needed for nausea or vomiting.   prenatal multivitamin Tabs tablet Take 1 tablet by mouth daily at 12 noon.   promethazine 12.5 MG tablet Commonly known as: PHENERGAN Take 12.5 mg by mouth every 6 (six) hours as needed for nausea or vomiting.   tamsulosin  0.4 MG Caps capsule Commonly known as: FLOMAX  Take 1 capsule (0.4 mg total) by mouth daily after supper.        Follow-up Information  Ob/Gyn, Anna Nelson Follow up.   Why: Make an appointment in the next week for follow up visit Contact information: [address] [place] [address] [phone]         Anna Valli BIRCH, MD Follow up.   Specialty: Urology Why: Follow up postpartum - call for appointment Contact information: [address] Floor [place] [address] [phone]         Home, [place] @ .   Contact information: [address] [place] [address] [phone]                 Signed: Ted LELON Nelson 11/30/2023, 3:39 PM

## 2023-11-30 NOTE — Anesthesia Postprocedure Evaluation (Signed)
 Anesthesia Post Note  Patient: Anna Nelson  Procedure(s) Performed: DIAGNOSTIC URETEROSCOPY (Left)     Patient location during evaluation: PACU Anesthesia Type: General Level of consciousness: sedated and patient cooperative Pain management: pain level controlled Vital Signs Assessment: post-procedure vital signs reviewed and stable Respiratory status: spontaneous breathing Cardiovascular status: stable Anesthetic complications: no   No notable events documented.  Last Vitals:  Vitals:   11/29/23 2312 11/30/23 0821  BP: 137/70 (!) 140/79  Pulse: 66 74  Resp: 16 16  Temp: 37.1 C 37.3 C  SpO2: 94% 99%    Last Pain:  Vitals:   11/30/23 0833  TempSrc:   PainSc: 5                  Norleen Pope

## 2024-02-21 ENCOUNTER — Other Ambulatory Visit: Payer: Self-pay

## 2024-02-21 ENCOUNTER — Inpatient Hospital Stay (HOSPITAL_COMMUNITY): Admitting: Anesthesiology

## 2024-02-21 ENCOUNTER — Inpatient Hospital Stay (HOSPITAL_COMMUNITY)
Admission: AD | Admit: 2024-02-21 | Discharge: 2024-02-25 | DRG: 786 | Disposition: A | Discharge status: Home / Self Care | Attending: Obstetrics and Gynecology | Admitting: Obstetrics and Gynecology

## 2024-02-21 ENCOUNTER — Encounter (HOSPITAL_COMMUNITY): Payer: Self-pay | Admitting: Student

## 2024-02-21 ENCOUNTER — Encounter (HOSPITAL_COMMUNITY)
Admission: AD | Disposition: A | Discharge status: Home / Self Care | Payer: Self-pay | Source: Home / Self Care | Attending: Obstetrics and Gynecology

## 2024-02-21 DIAGNOSIS — Z3A35 35 weeks gestation of pregnancy: Secondary | ICD-10-CM

## 2024-02-21 DIAGNOSIS — O43123 Velamentous insertion of umbilical cord, third trimester: Secondary | ICD-10-CM | POA: Diagnosis present

## 2024-02-21 DIAGNOSIS — O34211 Maternal care for low transverse scar from previous cesarean delivery: Secondary | ICD-10-CM

## 2024-02-21 DIAGNOSIS — O26893 Other specified pregnancy related conditions, third trimester: Secondary | ICD-10-CM | POA: Diagnosis present

## 2024-02-21 DIAGNOSIS — O4700 False labor before 37 completed weeks of gestation, unspecified trimester: Secondary | ICD-10-CM

## 2024-02-21 DIAGNOSIS — O42013 Preterm premature rupture of membranes, onset of labor within 24 hours of rupture, third trimester: Principal | ICD-10-CM | POA: Diagnosis present

## 2024-02-21 DIAGNOSIS — O43193 Other malformation of placenta, third trimester: Secondary | ICD-10-CM | POA: Diagnosis present

## 2024-02-21 DIAGNOSIS — Z3493 Encounter for supervision of normal pregnancy, unspecified, third trimester: Secondary | ICD-10-CM

## 2024-02-21 DIAGNOSIS — O1092 Unspecified pre-existing hypertension complicating childbirth: Secondary | ICD-10-CM | POA: Diagnosis present

## 2024-02-21 DIAGNOSIS — O24425 Gestational diabetes mellitus in childbirth, controlled by oral hypoglycemic drugs: Secondary | ICD-10-CM | POA: Diagnosis present

## 2024-02-21 DIAGNOSIS — Z98891 History of uterine scar from previous surgery: Secondary | ICD-10-CM

## 2024-02-21 DIAGNOSIS — O42019 Preterm premature rupture of membranes, onset of labor within 24 hours of rupture, unspecified trimester: Principal | ICD-10-CM

## 2024-02-21 LAB — COMPREHENSIVE METABOLIC PANEL WITH GFR
ALT: 21 U/L (ref 0–44)
AST: 30 U/L (ref 15–41)
Albumin: 3 g/dL — ABNORMAL LOW (ref 3.5–5.0)
Alkaline Phosphatase: 174 U/L — ABNORMAL HIGH (ref 38–126)
Anion gap: 15 (ref 5–15)
BUN: 14 mg/dL (ref 6–20)
CO2: 16 mmol/L — ABNORMAL LOW (ref 22–32)
Calcium: 10.7 mg/dL — ABNORMAL HIGH (ref 8.9–10.3)
Chloride: 103 mmol/L (ref 98–111)
Creatinine, Ser: 1 mg/dL (ref 0.44–1.00)
GFR, Estimated: >60 mL/min (ref >60)
Glucose, Bld: 103 mg/dL — ABNORMAL HIGH (ref 70–99)
Potassium: 3.8 mmol/L (ref 3.5–5.1)
Sodium: 134 mmol/L — ABNORMAL LOW (ref 135–145)
Total Bilirubin: 0.7 mg/dL (ref 0.0–1.2)
Total Protein: 7.1 g/dL (ref 6.5–8.1)

## 2024-02-21 LAB — CBC
HCT: 37.9 % (ref 36.0–46.0)
Hemoglobin: 13.2 g/dL (ref 12.0–15.0)
MCH: 31.9 pg (ref 26.0–34.0)
MCHC: 34.8 g/dL (ref 30.0–36.0)
MCV: 91.5 fL (ref 80.0–100.0)
Platelets: 235 10*3/uL (ref 150–400)
RBC: 4.14 MIL/uL (ref 3.87–5.11)
RDW: 12.8 % (ref 11.5–15.5)
WBC: 13.1 10*3/uL — ABNORMAL HIGH (ref 4.0–10.5)
nRBC: 0 % (ref 0.0–0.2)

## 2024-02-21 LAB — GLUCOSE, CAPILLARY
Glucose-Capillary: 98 mg/dL (ref 70–99)
Glucose-Capillary: 102 mg/dL — ABNORMAL HIGH (ref 70–99)

## 2024-02-21 LAB — POCT FERN TEST

## 2024-02-21 LAB — TYPE AND SCREEN
ABO/RH(D): A POS
Antibody Screen: NEGATIVE

## 2024-02-21 LAB — RPR: RPR Ser Ql: NONREACTIVE

## 2024-02-21 SURGERY — Surgical Case
Anesthesia: Spinal

## 2024-02-21 MED ORDER — CEFAZOLIN SODIUM-DEXTROSE 2-3 GM-%(50ML) IV SOLR
INTRAVENOUS | Status: DC | PRN
  Administered 2024-02-21: 2 g via INTRAVENOUS

## 2024-02-21 MED ORDER — NIFEDIPINE ER OSMOTIC RELEASE 30 MG PO TB24
30.0000 mg | ORAL_TABLET | Freq: Two times a day (BID) | ORAL | Status: DC
  Administered 2024-02-21 – 2024-02-22 (×3): 30 mg via ORAL
  Filled 2024-02-21 (×3): qty 1

## 2024-02-21 MED ORDER — OXYTOCIN-SODIUM CHLORIDE 30-0.9 UT/500ML-% IV SOLN
INTRAVENOUS | Status: DC | PRN
  Administered 2024-02-21: 300 mL via INTRAVENOUS

## 2024-02-21 MED ORDER — PHENYLEPHRINE HCL-NACL 20-0.9 MG/250ML-% IV SOLN
INTRAVENOUS | Status: DC | PRN
  Administered 2024-02-21: 60 ug/min via INTRAVENOUS

## 2024-02-21 MED ORDER — SENNOSIDES-DOCUSATE SODIUM 8.6-50 MG PO TABS
2.0000 | ORAL_TABLET | Freq: Every day | ORAL | Status: DC
  Administered 2024-02-22 – 2024-02-25 (×4): 2 via ORAL
  Filled 2024-02-21 (×4): qty 2

## 2024-02-21 MED ORDER — SIMETHICONE 80 MG PO CHEW
80.0000 mg | CHEWABLE_TABLET | ORAL | Status: DC | PRN
  Administered 2024-02-23: 80 mg via ORAL

## 2024-02-21 MED ORDER — ONDANSETRON HCL 4 MG/2ML IJ SOLN: 4.0000 mg | Freq: Three times a day (TID) | INTRAMUSCULAR | Status: DC | PRN

## 2024-02-21 MED ORDER — COCONUT OIL OIL
1.0000 | TOPICAL_OIL | Status: DC | PRN
  Administered 2024-02-24: 1 via TOPICAL

## 2024-02-21 MED ORDER — NALOXONE HCL 0.4 MG/ML IJ SOLN: 0.4000 mg | INTRAMUSCULAR | Status: DC | PRN

## 2024-02-21 MED ORDER — SIMETHICONE 80 MG PO CHEW
80.0000 mg | CHEWABLE_TABLET | Freq: Three times a day (TID) | ORAL | Status: DC
  Administered 2024-02-21 – 2024-02-25 (×10): 80 mg via ORAL
  Filled 2024-02-21 (×12): qty 1

## 2024-02-21 MED ORDER — MORPHINE SULFATE (PF) 0.5 MG/ML IJ SOLN
INTRAMUSCULAR | Status: AC
  Filled 2024-02-21: qty 10

## 2024-02-21 MED ORDER — DIBUCAINE (PERIANAL) 1 % EX OINT: 1.0000 | TOPICAL_OINTMENT | CUTANEOUS | Status: DC | PRN

## 2024-02-21 MED ORDER — BUPIVACAINE IN DEXTROSE 0.75-8.25 % IT SOLN
INTRATHECAL | Status: DC | PRN
  Administered 2024-02-21: 1.6 mL via INTRATHECAL

## 2024-02-21 MED ORDER — WITCH HAZEL-GLYCERIN EX PADS: 1.0000 | MEDICATED_PAD | CUTANEOUS | Status: DC | PRN

## 2024-02-21 MED ORDER — FENTANYL CITRATE (PF) 100 MCG/2ML IJ SOLN
INTRAMUSCULAR | Status: AC
  Filled 2024-02-21: qty 2

## 2024-02-21 MED ORDER — PRENATAL MULTIVITAMIN CH
1.0000 | ORAL_TABLET | Freq: Every day | ORAL | Status: DC
  Administered 2024-02-22 – 2024-02-25 (×3): 1 via ORAL
  Filled 2024-02-21 (×5): qty 1

## 2024-02-21 MED ORDER — ACETAMINOPHEN 500 MG PO TABS
1000.0000 mg | ORAL_TABLET | Freq: Four times a day (QID) | ORAL | Status: DC
  Administered 2024-02-21 – 2024-02-25 (×15): 1000 mg via ORAL
  Filled 2024-02-21 (×17): qty 2

## 2024-02-21 MED ORDER — MORPHINE SULFATE (PF) 0.5 MG/ML IJ SOLN
INTRAMUSCULAR | Status: DC | PRN
  Administered 2024-02-21: 150 ug via INTRATHECAL

## 2024-02-21 MED ORDER — LACTATED RINGERS IV SOLN: INTRAVENOUS | Status: DC | PRN

## 2024-02-21 MED ORDER — TRANEXAMIC ACID-NACL 1000-0.7 MG/100ML-% IV SOLN
INTRAVENOUS | Status: DC | PRN
Start: 2024-02-21 — End: 2024-02-21
  Administered 2024-02-21: 1000 mg via INTRAVENOUS

## 2024-02-21 MED ORDER — CEFAZOLIN SODIUM-DEXTROSE 2-4 GM/100ML-% IV SOLN: 2.0000 g | INTRAVENOUS | Status: DC

## 2024-02-21 MED ORDER — NIFEDIPINE ER OSMOTIC RELEASE 30 MG PO TB24
30.0000 mg | ORAL_TABLET | Freq: Every day | ORAL | Status: DC
  Administered 2024-02-21: 30 mg via ORAL
  Filled 2024-02-21: qty 1

## 2024-02-21 MED ORDER — EPINEPHRINE 1 MG/10ML IJ SOSY
PREFILLED_SYRINGE | INTRAMUSCULAR | Status: AC
  Filled 2024-02-21: qty 10

## 2024-02-21 MED ORDER — ZOLPIDEM TARTRATE 5 MG PO TABS: 5.0000 mg | ORAL_TABLET | Freq: Every evening | ORAL | Status: DC | PRN

## 2024-02-21 MED ORDER — ACETAMINOPHEN 10 MG/ML IV SOLN: 1000.0000 mg | Freq: Once | INTRAVENOUS | Status: DC | PRN

## 2024-02-21 MED ORDER — KETOROLAC TROMETHAMINE 30 MG/ML IJ SOLN: 30.0000 mg | Freq: Four times a day (QID) | INTRAMUSCULAR | Status: DC | PRN

## 2024-02-21 MED ORDER — ACETAMINOPHEN 10 MG/ML IV SOLN
INTRAVENOUS | Status: DC | PRN
  Administered 2024-02-21: 1000 mg via INTRAVENOUS

## 2024-02-21 MED ORDER — OXYCODONE HCL 5 MG PO TABS
5.0000 mg | ORAL_TABLET | ORAL | Status: DC | PRN
  Administered 2024-02-22 – 2024-02-23 (×2): 5 mg via ORAL
  Administered 2024-02-23: 10 mg via ORAL
  Administered 2024-02-24 – 2024-02-25 (×3): 5 mg via ORAL
  Filled 2024-02-21 (×2): qty 1
  Filled 2024-02-21: qty 2
  Filled 2024-02-21 (×3): qty 1

## 2024-02-21 MED ORDER — SODIUM CHLORIDE 0.9 % IV SOLN
INTRAVENOUS | Status: DC | PRN
  Administered 2024-02-21: 500 mg via INTRAVENOUS

## 2024-02-21 MED ORDER — OXYTOCIN-SODIUM CHLORIDE 30-0.9 UT/500ML-% IV SOLN
2.5000 [IU]/h | INTRAVENOUS | Status: AC
  Administered 2024-02-21: 2.5 [IU]/h via INTRAVENOUS
  Filled 2024-02-21: qty 500

## 2024-02-21 MED ORDER — DIPHENHYDRAMINE HCL 50 MG/ML IJ SOLN: 12.5000 mg | Freq: Every evening | INTRAMUSCULAR | Status: DC | PRN

## 2024-02-21 MED ORDER — ACETAMINOPHEN 500 MG PO TABS: 1000.0000 mg | ORAL_TABLET | Freq: Four times a day (QID) | ORAL | Status: DC

## 2024-02-21 MED ORDER — SOD CITRATE-CITRIC ACID 500-334 MG/5ML PO SOLN: 30.0000 mL | ORAL | Status: DC

## 2024-02-21 MED ORDER — LIDOCAINE-EPINEPHRINE (PF) 2 %-1:200000 IJ SOLN
INTRAMUSCULAR | Status: AC
  Filled 2024-02-21: qty 40

## 2024-02-21 MED ORDER — OXYCODONE HCL 5 MG/5ML PO SOLN: 5.0000 mg | Freq: Once | ORAL | Status: DC | PRN

## 2024-02-21 MED ORDER — MENTHOL 3 MG MT LOZG: 1.0000 | LOZENGE | OROMUCOSAL | Status: DC | PRN

## 2024-02-21 MED ORDER — SODIUM CHLORIDE 0.9% FLUSH: 3.0000 mL | INTRAVENOUS | Status: DC | PRN

## 2024-02-21 MED ORDER — FENTANYL CITRATE (PF) 100 MCG/2ML IJ SOLN: 25.0000 ug | INTRAMUSCULAR | Status: DC | PRN

## 2024-02-21 MED ORDER — DEXAMETHASONE SODIUM PHOSPHATE 10 MG/ML IJ SOLN
INTRAMUSCULAR | Status: DC | PRN
  Administered 2024-02-21: 10 mg via INTRAVENOUS

## 2024-02-21 MED ORDER — NIFEDIPINE ER OSMOTIC RELEASE 30 MG PO TB24: 30.0000 mg | ORAL_TABLET | Freq: Every day | ORAL | Status: DC

## 2024-02-21 MED ORDER — SODIUM CHLORIDE 0.9 % IV SOLN: 500.0000 mg | INTRAVENOUS | Status: DC

## 2024-02-21 MED ORDER — IBUPROFEN 600 MG PO TABS
600.0000 mg | ORAL_TABLET | Freq: Four times a day (QID) | ORAL | Status: DC
  Administered 2024-02-22 – 2024-02-25 (×12): 600 mg via ORAL
  Filled 2024-02-21 (×13): qty 1

## 2024-02-21 MED ORDER — SOD CITRATE-CITRIC ACID 500-334 MG/5ML PO SOLN
ORAL | Status: AC
  Filled 2024-02-21: qty 30

## 2024-02-21 MED ORDER — PHENYLEPHRINE HCL-NACL 20-0.9 MG/250ML-% IV SOLN
INTRAVENOUS | Status: AC
  Filled 2024-02-21: qty 250

## 2024-02-21 MED ORDER — NALOXONE HCL 4 MG/10ML IJ SOLN: 1.0000 ug/kg/h | INTRAVENOUS | Status: DC | PRN

## 2024-02-21 MED ORDER — FENTANYL CITRATE (PF) 100 MCG/2ML IJ SOLN
INTRAMUSCULAR | Status: DC | PRN
  Administered 2024-02-21: 15 ug via INTRATHECAL

## 2024-02-21 MED ORDER — DIPHENHYDRAMINE HCL 25 MG PO CAPS: 25.0000 mg | ORAL_CAPSULE | Freq: Four times a day (QID) | ORAL | Status: DC | PRN

## 2024-02-21 MED ORDER — FAMOTIDINE IN NACL 20-0.9 MG/50ML-% IV SOLN
INTRAVENOUS | Status: AC
Start: 2024-02-21 — End: 2024-02-21
  Filled 2024-02-21: qty 50

## 2024-02-21 MED ORDER — LACTATED RINGERS IV BOLUS
1000.0000 mL | Freq: Once | INTRAVENOUS | Status: AC
  Administered 2024-02-21: 1000 mL via INTRAVENOUS

## 2024-02-21 MED ORDER — OXYCODONE HCL 5 MG PO TABS: 5.0000 mg | ORAL_TABLET | Freq: Once | ORAL | Status: DC | PRN

## 2024-02-21 MED ORDER — LACTATED RINGERS IV SOLN: INTRAVENOUS | Status: DC

## 2024-02-21 MED ORDER — ONDANSETRON HCL 4 MG/2ML IJ SOLN
INTRAMUSCULAR | Status: DC | PRN
  Administered 2024-02-21: 4 mg via INTRAVENOUS

## 2024-02-21 MED ORDER — KETOROLAC TROMETHAMINE 30 MG/ML IJ SOLN
30.0000 mg | Freq: Four times a day (QID) | INTRAMUSCULAR | Status: AC
  Administered 2024-02-21 – 2024-02-22 (×3): 30 mg via INTRAVENOUS
  Filled 2024-02-21 (×3): qty 1

## 2024-02-21 SURGICAL SUPPLY — 35 items
BENZOIN TINCTURE PRP APPL 2/3 (GAUZE/BANDAGES/DRESSINGS) IMPLANT
CHLORAPREP W/TINT 26 (MISCELLANEOUS) ×2 IMPLANT
CLAMP UMBILICAL CORD (MISCELLANEOUS) ×1 IMPLANT
CLOTH BEACON ORANGE TIMEOUT ST (SAFETY) ×1 IMPLANT
DERMABOND ADVANCED .7 DNX12 (GAUZE/BANDAGES/DRESSINGS) ×1 IMPLANT
DRSG OPSITE POSTOP 4X10 (GAUZE/BANDAGES/DRESSINGS) ×1 IMPLANT
ELECT REM PT RETURN 9FT ADLT (ELECTROSURGICAL) ×1 IMPLANT
ELECTRODE REM PT RTRN 9FT ADLT (ELECTROSURGICAL) ×1 IMPLANT
EXTRACTOR VACUUM BELL STYLE (SUCTIONS) IMPLANT
GAUZE SPONGE 4X4 12PLY STRL LF (GAUZE/BANDAGES/DRESSINGS) IMPLANT
GLOVE BIOGEL PI IND STRL 6.5 (GLOVE) ×1 IMPLANT
GLOVE BIOGEL PI IND STRL 7.0 (GLOVE) ×2 IMPLANT
GLOVE SURG SS PI 6.0 STRL IVOR (GLOVE) ×1 IMPLANT
GOWN STRL REUS W/TWL LRG LVL3 (GOWN DISPOSABLE) ×2 IMPLANT
HEMOSTAT ARISTA ABSORB 3G PWDR (HEMOSTASIS) IMPLANT
KIT ABG SYR 3ML LUER SLIP (SYRINGE) IMPLANT
MAT PREVALON FULL STRYKER (MISCELLANEOUS) IMPLANT
NDL HYPO 25X5/8 SAFETYGLIDE (NEEDLE) IMPLANT
NEEDLE HYPO 25X5/8 SAFETYGLIDE (NEEDLE) IMPLANT
NS IRRIG 1000ML POUR BTL (IV SOLUTION) ×1 IMPLANT
PACK C SECTION WH (CUSTOM PROCEDURE TRAY) ×1 IMPLANT
PAD ABD 8X10 STRL (GAUZE/BANDAGES/DRESSINGS) IMPLANT
PAD OB MATERNITY 4.3X12.25 (PERSONAL CARE ITEMS) ×1 IMPLANT
RTRCTR C-SECT PINK 25CM LRG (MISCELLANEOUS) IMPLANT
STRIP CLOSURE SKIN 1/2X4 (GAUZE/BANDAGES/DRESSINGS) IMPLANT
SUT PDS AB 0 CTX 36 PDP370T (SUTURE) IMPLANT
SUT PLAIN ABS 2-0 CT1 27XMFL (SUTURE) IMPLANT
SUT VIC AB 0 CT1 36 (SUTURE) ×2 IMPLANT
SUT VIC AB 0 CTX36XBRD ANBCTRL (SUTURE) ×2 IMPLANT
SUT VIC AB 2-0 CT1 TAPERPNT 27 (SUTURE) ×2 IMPLANT
SUT VIC AB 4-0 KS 27 (SUTURE) ×1 IMPLANT
SUT VICRYL+ 3-0 36IN CT-1 (SUTURE) ×1 IMPLANT
TOWEL OR 17X24 6PK STRL BLUE (TOWEL DISPOSABLE) ×1 IMPLANT
TRAY FOLEY W/BAG SLVR 14FR LF (SET/KITS/TRAYS/PACK) ×1 IMPLANT
WATER STERILE IRR 1000ML POUR (IV SOLUTION) ×1 IMPLANT

## 2024-02-21 NOTE — MAU Note (Signed)
 Pt feeling vaginal- rectal pressure. ''Darlina Sicilian CNM along for transfer to thOB OR

## 2024-02-21 NOTE — MAU Note (Signed)
..  Anna Nelson is a 38 y.o. at [redacted]w[redacted]d here in MAU reporting: leaking of fluid around 2300, the fluid has been clear. Contractions since midnight that are now every few minutes. +FM  Pain score: 8/10 Vitals:   02/21/24 0339  BP: (!) 159/91  Pulse: 66  Resp: 18  Temp: 98.1 F (36.7 C)  SpO2: 100%     FHT:145 Lab orders placed from triage:  UA

## 2024-02-21 NOTE — Anesthesia Preprocedure Evaluation (Signed)
 Anesthesia Evaluation  Patient identified by MRN, date of birth, ID band Patient awake    Reviewed: Allergy & Precautions, NPO status , Patient's Chart, lab work & pertinent test results  History of Anesthesia Complications Negative for: history of anesthetic complications  Airway Mallampati: III  TM Distance: >3 FB Neck ROM: Full    Dental   Pulmonary neg pulmonary ROS   Pulmonary exam normal breath sounds clear to auscultation       Cardiovascular hypertension (chronic; on labetalol, nifedipine), Pt. on home beta blockers and Pt. on medications  Rhythm:Regular Rate:Normal     Neuro/Psych negative neurological ROS     GI/Hepatic negative GI ROS, Neg liver ROS,,,  Endo/Other  diabetes, Gestational, Oral Hypoglycemic Agents    Renal/GU negative Renal ROS     Musculoskeletal   Abdominal   Peds  Hematology negative hematology ROS (+) Lab Results      Component                Value               Date                      WBC                      13.1 (H)            02/21/2024                HGB                      13.2                02/21/2024                HCT                      37.9                02/21/2024                MCV                      91.5                02/21/2024                PLT                      235                 02/21/2024              Anesthesia Other Findings Patient painfully contracting. Last ate/drank at 9:30 pm last night. Vomiting.  Reproductive/Obstetrics (+) Pregnancy                              Anesthesia Physical Anesthesia Plan  ASA: 2  Anesthesia Plan: Spinal   Post-op Pain Management: Ofirmev IV (intra-op)*   Induction: Intravenous  PONV Risk Score and Plan: 2 and Ondansetron, Dexamethasone and Treatment may vary due to age or medical condition  Airway Management Planned: Natural Airway  Additional Equipment:   Intra-op Plan:    Post-operative Plan:   Informed Consent: I have reviewed the patients History and Physical, chart, labs and discussed  the procedure including the risks, benefits and alternatives for the proposed anesthesia with the patient or authorized representative who has indicated his/her understanding and acceptance.       Plan Discussed with: Anesthesiologist and CRNA  Anesthesia Plan Comments: (I have discussed risks of neuraxial anesthesia including but not limited to infection, bleeding, nerve injury, back pain, headache, seizures, and failure of block. Patient denies bleeding disorders and is not currently anticoagulated. Labs have been reviewed. Risks and benefits discussed. All patient's questions answered.  )         Anesthesia Quick Evaluation

## 2024-02-21 NOTE — Op Note (Signed)
 C-Section Operative Note  Date: 02/21/24  Preoperative Diagnosis: 1) [redacted] weeks gestation  2) Preterm labor 3) Previous cesarean section  Postoperative Diagnosis:  1) [redacted] weeks gestation  2) Preterm labor 3) Previous cesarean section  Procedure: Repeat low transverse cesarean section without extension Surgeon: Kevin Fenton, DO Assist: A. Leanora Cover, MD    An experienced assistant was required given the standard of surgical care given the complexity of the case and maternal body habitus.  This assistant was needed for exposure, dissection, suctioning, retraction, instrument exchange, assisting with delivery with administration of fundal pressure, and for overall help during the procedure.    Operative Findings: Gravid uterus. Scant clear amniotic fluid. Viable female infant in cephalic  position weighing 2240 with APGARS of 8 and 9 at 1 and 5 minutes, respectively. Normal fallopian tubes and ovaries bilaterally. Filmy posterior uterine adhesions. Velamentous cord insertion. Blood-tinged urine after delivery of fetus. Intact bladder dome without spillage.  Specimens: Placenta to pathology EBL 151 IVF 1 L UOP 200  Patient Course: 38 yo G2P1 at 35 weeks with pregnancy complicated by chronic hypertension, advanced maternal age, gestational diabetes, marginal cord insertion and history of previous cesarean section presented in active labor following spontaneous rupture of membranes.  Consent:  R/B/A of cesarean section discussed with patient. Alternative would be vaginal delivery which would mean shorter postpartum stay and decreased risk of bleeding. Risks of cesarean section include but are not limited to infection of the uterus, pelvic organs, or skin, inadvertent injury to internal organs, such as bowel or bladder, vasculature, and nerves. If there is major injury, extensive surgery may be required. If injury is minor, it may be treated with relative ease and repaired at the time of injury. Discussed  possibility of excessive blood loss and transfusion. If bleeding cannot be controlled using medical or minor surgical methods, a cesarean hysterectomy may be performed which would mean no future fertility. Patient accepts the possibility of blood transfusion, if necessary. Patient understands and agrees to move forward with section.   Operative Procedure: Patient was taken to the operating room where spinal anesthesia was found to be adequate by Allis clamp test. She was prepped and draped in the normal sterile fashion in the dorsal supine position with a leftward tilt. An appropriate time out was performed. A Pfannenstiel skin incision was then made with the scalpel and carried through to the underlying layer of fascia by sharp dissection and Bovie cautery. The fascia was nicked in the midline and the incision was extended laterally with Mayo scissors. The superior aspect of the incision was grasped Coker clamps and dissected off the underlying rectus muscles. In a similar fashion the inferior aspect was dissected off the rectus muscles. Rectus muscles were separated in the midline and the peritoneal cavity entered with Metzenbaum scissors. The peritoneal incision was then extended both superiorly and inferiorly with careful attention to avoid both bowel and bladder. The Alexis self-retaining wound retractor was then placed within the incision and the lower uterine segment exposed, which was very thin in nature. The bladder flap was developed with Metzenbaum scissors and pushed away from the lower uterine segment. The lower uterine segment was then incised in a low transverse fashion and the cavity itself entered bluntly. The incision was extended bluntly. Amniotic sac was ruptured and scant clear fluid was noted. The infant's head was deep within the maternal pelvis. The head was then flexed and lifted and delivered from the incision without difficulty using the standard movements. The remainder  of the infant  delivered and the nose and mouth bulb suctioned with the cord clamped and cut as well. The infant was handed off to the waiting pediatric staff. The placenta was then spontaneously expressed from the uterus and the uterus cleared of all clots and debris with moist lap sponge. Tranexamic acid was administered prophylactically. The urine was noted to be blood-tinged at this time, previously clear. The bladder was then backfilled with sterile milk without spillage noted. The uterine incision was then repaired in 2 layers the first layer was a running locked layer of 0-vicryl and the second an imbricating layer of the same suture. Arista was applied to the hysterotomy to maintain hemostasis. The tubes and ovaries were inspected and the gutters cleared of all clots and debris. The uterine incision was inspected and found to be hemostatic. All instruments and sponges as well as the Alexis retractor were then removed from the abdomen. The rectus muscles were then reapproximated with several interrupted mattress sutures of 2-0 Vicryl. The fascia was then closed with 0 Vicryl in a running fashion. Subcutaneous tissue was irrigated, dried, and then reapproximated with 2-0 plain in a running fashion. The skin was closed with a subcuticular stitch of 4-0 Vicryl on a Keith needle and then reinforced with Dermabond and a Honeycomb dressing. At the conclusion of the procedure all instruments and sponge counts were correct. Patient was taken to the recovery room in good condition with her baby accompanying her skin to skin.   Truett Perna

## 2024-02-21 NOTE — Anesthesia Postprocedure Evaluation (Signed)
 Anesthesia Post Note  Patient: Anna Nelson  Procedure(s) Performed: CESAREAN DELIVERY     Patient location during evaluation: PACU Anesthesia Type: Spinal Level of consciousness: awake Pain management: pain level controlled Vital Signs Assessment: post-procedure vital signs reviewed and stable Respiratory status: spontaneous breathing, respiratory function stable and nonlabored ventilation Cardiovascular status: blood pressure returned to baseline and stable Postop Assessment: no headache, no backache and no apparent nausea or vomiting Anesthetic complications: no   No notable events documented.  Last Vitals:  Vitals:   02/21/24 0645 02/21/24 0700  BP: 120/79 (!) 134/91  Pulse: 66 67  Resp: 12 14  Temp: 36.6 C   SpO2: 98% 99%    Last Pain:  Vitals:   02/21/24 0645  TempSrc: Oral  PainSc: 0-No pain   Pain Goal:    LLE Motor Response: Purposeful movement (02/21/24 0645) LLE Sensation: Increased (02/21/24 0645) RLE Motor Response: Purposeful movement (02/21/24 0645) RLE Sensation: Increased (02/21/24 0645)     Epidural/Spinal Function Cutaneous sensation: Able to Discern Pressure (02/21/24 0645), Patient able to flex knees: Yes (02/21/24 0645), Patient able to lift hips off bed: No (02/21/24 0645), Back pain beyond tenderness at insertion site: No (02/21/24 0645), Progressively worsening motor and/or sensory loss: No (02/21/24 0645), Bowel and/or bladder incontinence post epidural: No (02/21/24 0645)  Linton Rump

## 2024-02-21 NOTE — H&P (Signed)
 Anna Nelson is a 38 y.o. female G3P1011 @ 35.4 who presents in labor following SROM of clear fluid at 2300.   Pregnancy is complicated by:  - GDMA2: Metformin at bedtime, has not had brought her logs with her to her visits for review - cHTN: Procardia 30 XL, baseline labs normal - Marginal cord insertion - AMA: NIPT low risk  - Nephrolithiasis: was admitted during 2nd trimester - Previous cesarean section: planned repeat c-section OB History     Gravida  3   Para  1   Term  1   Preterm      AB  1   Living  1      SAB  1   IAB      Ectopic      Multiple  0   Live Births  1          Past Medical History:  Diagnosis Date   Gestational diabetes    Hypertension    Past Surgical History:  Procedure Laterality Date   CESAREAN SECTION N/A 09/06/2019   Procedure: CESAREAN SECTION;  Surgeon: Levi Aland, MD;  Location: [facility] LD ORS;  Service: Obstetrics;  Laterality: N/A;   CYSTOSCOPY WITH RETROGRADE PYELOGRAM, URETEROSCOPY AND STENT PLACEMENT Left 11/29/2023   Procedure: DIAGNOSTIC URETEROSCOPY;  Surgeon: Noel Christmas, MD;  Location: [facility] OR;  Service: Urology;  Laterality: Left;   WISDOM TOOTH EXTRACTION     Family History: family history includes Diabetes in her father; Lymphoma in her maternal grandmother. Social History:  reports that she has never smoked. She has never used smokeless tobacco. She reports that she does not drink alcohol and does not use drugs.     Maternal Diabetes: Yes:  Diabetes Type:  Insulin/Medication controlled Genetic Screening: Normal Maternal Ultrasounds/Referrals: marginal cord insertion Fetal Ultrasounds or other Referrals:  None Maternal Substance Abuse:  No Significant Maternal Medications:  Meds include: Other: Procardia Significant Maternal Lab Results:  Other: GBS unknown (previously positive in prior pregnancy) Number of Prenatal Visits:greater than 3 verified prenatal visits Maternal Vaccinations:TDap Other  Comments:  None  Review of Systems  Unable to perform ROS: Acuity of condition  Gastrointestinal:  Positive for abdominal pain.  Genitourinary:  Positive for pelvic pain and vaginal discharge.   History Dilation: 5.5 Effacement (%): 90 Station: -2 Exam by:: Joycie Peek Hill CNM Blood pressure (!) 160/90, pulse 66, temperature 98.1 F (36.7 C), resp. rate 18, height 5\' 1"  (1.549 m), weight 75.9 kg, last menstrual period 06/17/2023, SpO2 100%, unknown if currently breastfeeding. Maternal Exam:  Uterine Assessment: Contraction strength is firm.    Physical Exam Constitutional:      General: She is in acute distress.     Appearance: She is obese.  HENT:     Head: Normocephalic and atraumatic.  Pulmonary:     Comments: Labored breathing Abdominal:     Comments: gravid  Skin:    General: Skin is warm and dry.     Prenatal labs: ABO, Rh: --/--/PENDING (04/07 4098) Antibody: PENDING (04/07 0413) Rubella:  immune RPR:   non-reactive HBsAg:   negative HIV:   negative GBS:   unknown  Assessment/Plan: 38 yo G2P1001 @ 35.4 with history of previous cesarean section presents in labor  - Admit for urgent repeat cesarean section  - Ancef/azithro   Junius Creamer 02/21/2024, 4:32 AM

## 2024-02-21 NOTE — Transfer of Care (Signed)
 Immediate Anesthesia Transfer of Care Note  Patient: Anna Nelson  Procedure(s) Performed: CESAREAN DELIVERY  Patient Location: PACU  Anesthesia Type:Spinal  Level of Consciousness: awake, alert , and oriented  Airway & Oxygen Therapy: Patient Spontanous Breathing  Post-op Assessment: Report given to RN and Post -op Vital signs reviewed and stable  Post vital signs: stable  Last Vitals:  Vitals Value Taken Time  BP 113/68 02/21/24 0618  Temp    Pulse 63 02/21/24 0624  Resp 15 02/21/24 0624  SpO2 98 % 02/21/24 0624  Vitals shown include unfiled device data.  Last Pain:  Vitals:   02/21/24 0341  PainSc: 8          Complications: No notable events documented.

## 2024-02-21 NOTE — MAU Provider Note (Signed)
 Event Date/Time   First Provider Initiated Contact with Patient 02/21/24 0404      S Ms. Anna Nelson is a 38 y.o. G14P1011 pregnant female at [redacted]w[redacted]d who presents to MAU today with complaint of suspected SROM at 2300 02/20/24. She reports that she has continued to leak since this time and started having contractions shortly after ROM. The contractions are approximately 3 mins apart and are strong.   Receives care at [facility]. Prenatal records reviewed.  Pertinent items noted in HPI and remainder of comprehensive ROS otherwise negative.   O BP (!) 160/90 (BP Location: Left Arm)   Pulse 66   Temp 98.1 F (36.7 C)   Resp 18   Ht 5\' 1"  (1.549 m)   Wt 75.9 kg   LMP 06/17/2023   SpO2 100%   BMI 31.61 kg/m  Physical Exam Constitutional:      Comments: Working hard through contractions  HENT:     Head: Normocephalic.  Cardiovascular:     Rate and Rhythm: Normal rate.  Pulmonary:     Effort: Pulmonary effort is normal.  Skin:    General: Skin is warm.     Capillary Refill: Capillary refill takes less than 2 seconds.  Neurological:     General: No focal deficit present.     Mental Status: She is alert.  Psychiatric:        Mood and Affect: Mood normal.        Behavior: Behavior normal.        Thought Content: Thought content normal.        Judgment: Judgment normal.    Dilation: 5.5 Effacement (%): 90 Station: -2 Presentation: Vertex Exam by:: Lamont Snowball CNM    MDM: Crist Fat slide to confirm ROM: fern positive SCE - dilation and effacement of cervix present and consistent with labor Review of medical records Call to  Dr. Katrinka Blazing  MAU Course:  A Preterm premature rupture of membranes with onset of labor within 24 hours of rupture  Preterm contractions  Active preterm labor  Medical screening exam complete Fern slide to confirm ROM: fern positive SCE - dilation and effacement of cervix present and consistent with labor Review of medical  records  P Call to Dr. Kevin Fenton with findings. Dr. Katrinka Blazing reports she is en route to hospital. Care turned over to primary OB care team.    Lamont Snowball, MSN, CNM 02/21/2024 4:30 AM  Certified Nurse Midwife, [facility]

## 2024-02-21 NOTE — Anesthesia Procedure Notes (Signed)
 Spinal  Patient location during procedure: OR Start time: 02/21/2024 4:51 AM End time: 02/21/2024 4:53 AM Reason for block: surgical anesthesia Staffing Performed: anesthesiologist  Anesthesiologist: Linton Rump, MD Performed by: Linton Rump, MD Authorized by: Linton Rump, MD   Preanesthetic Checklist Completed: patient identified, IV checked, site marked, risks and benefits discussed, surgical consent, monitors and equipment checked, pre-op evaluation and timeout performed Spinal Block Patient position: sitting Prep: DuraPrep Patient monitoring: blood pressure and continuous pulse ox Approach: midline Location: L3-4 Injection technique: single-shot Needle Needle type: Pencan  Needle gauge: 24 G Needle length: 9 cm Assessment Sensory level: T4 Additional Notes Risks and benefits of neuraxial anesthesia including, but not limited to, infection, bleeding, local anesthetic toxicity, headache, hypotension, back pain, block failure, etc. were discussed with the patient. The patient expressed understanding and consented to the procedure. I confirmed that the patient has no bleeding disorders and is not taking blood thinners. I confirmed the patient's last platelet count with the nurse. Monitors were applied. A time-out was performed immediately prior to the procedure. Sterile technique was used throughout the whole procedure.   1 attempt(s)

## 2024-02-21 NOTE — Progress Notes (Signed)
 Procardia given as ordered. BP rechecked and called to MD. New orders received to increase BP checks  to every two hours.  Will continue to monitor.

## 2024-02-21 NOTE — Progress Notes (Signed)
 Subjective: Postpartum Day 0: Cesarean Delivery Patient is feeling okay, tired from events of the night. No vision changes, RUQ pain or headache. Pain is controlled. Has not yet voided or ambulated.   Objective: Patient Vitals for the past 24 hrs:  BP Temp Temp src Pulse Resp SpO2 Height Weight  02/21/24 0925 (!) 173/89 98.8 F (37.1 C) Oral 65 18 99 % -- --  02/21/24 0830 (!) 161/95 99.2 F (37.3 C) Oral (!) 52 18 99 % -- --  02/21/24 0735 (!) 152/77 98.7 F (37.1 C) Oral (!) 59 20 100 % -- --  02/21/24 0715 (!) 143/93 -- -- 68 (!) 21 99 % -- --  02/21/24 0700 (!) 134/91 -- -- 67 14 99 % -- --  02/21/24 0645 120/79 97.8 F (36.6 C) Oral 66 12 98 % -- --  02/21/24 0630 127/81 -- -- 70 14 99 % -- --  02/21/24 0618 113/68 97.6 F (36.4 C) Oral -- -- -- -- --  02/21/24 0430 -- -- -- -- -- 100 % -- --  02/21/24 0405 -- -- -- -- -- 100 % -- --  02/21/24 0400 -- -- -- -- -- 100 % -- --  02/21/24 0359 (!) 160/90 -- -- 66 -- -- -- --  02/21/24 0339 (!) 159/91 98.1 F (36.7 C) -- 66 18 100 % 5\' 1"  (1.549 m) 75.9 kg    Physical Exam:  General: alert, cooperative, and no distress, breastfeeding Lochia: appropriate Uterine Fundus: firm Incision: no significant drainage, no dehiscence, no significant erythema DVT Evaluation: No evidence of DVT seen on physical exam.  Recent Labs    02/21/24 0417  HGB 13.2  HCT 37.9    Assessment/Plan: Mckinzey Bushong [id number] POD#0 sp RCS at [redacted]w[redacted]d for preterm labor, history of cesarean 1. PPC: routine PP care, excellent urine output, can remove foley when able to ambulate.  2. Rh pos 3. CHTN: on procardia XL 30mg  at night, labs normal on admission. Bps mildly elevated this morning, however has had persistent severe range blood pressures for the past 2 hours and I was not notified. I spoke with RN who will give her Procardia XL 30mg  now and recheck BP. If no improvement will give IV labetalol and consider magnesium for seizure prophylaxis.  4. A2GDM:  fasting CBG ordered for tomorrow AM 5. Desires circumcision for baby boy, plan to complete prior to discharge.    Charlett Nose 02/21/2024, 10:58 AM

## 2024-02-21 NOTE — Lactation Note (Addendum)
 This note was copied from a baby's chart. Lactation Consultation Note  Patient Name: Anna Nelson Date: 02/21/2024 Age:36 hours Reason for consult: Initial assessment;Late-preterm 34-36.6wks;Infant < 5lbs;Maternal endocrine disorder (GDM)  P2, Baby [redacted]w[redacted]d.  Mother states she would like to breastfeed but is aware baby will need to be supplemented per LPI guidelines.  Per parents baby breastfed for 30 min in L&D at [phone].   BS 34.  Baby was given 8 ml of 22 kcal formula with Nfant white standard nipple. Placed baby skin to skin on mother's chest. LC set up DEBP.  Will instruct later after visitors leave. Suggest if baby cues, mother call for [facility] assistance.   Returned to room to review pump instructions.  Fitted with 18 mm flanges. Baby currently skin to skin to BS recheck.  Mother breastfed baby for an additonal 15 min and supplemented with 12 ml kcal Neosure formula.   Plan: Keep baby STS as much as possible  Offer breast when baby cues that he/she is hungry, or awaken baby for feeding at 3 hrs.  Breast feed baby, asking for help prn.  Limit to 30 mins so not to overtire baby. If baby does not latch after 5-10 min of attempt - give supplemental breastmilk/formula.  Slow flow nipple bottle is an option.   Pump both breasts 15 minutes on initiation setting, adding hand expression to collect as much colostrum as possible to feed baby.  Feed baby supplemental EBM+/formula after breastfeeding per LPTI volume guidelines increasing per day of life and as baby desires.    Maternal Data Does the patient have breastfeeding experience prior to this delivery?: Yes  Feeding Mother's Current Feeding Choice: Breast Milk and Formula Nipple Type: Nfant Standard Flow (white)  LATCH Score Latch: Grasps breast easily, tongue down, lips flanged, rhythmical sucking.  Audible Swallowing: A few with stimulation  Type of Nipple: Everted at rest and after stimulation  Comfort (Breast/Nipple):  Soft / non-tender  Hold (Positioning): Full assist, staff holds infant at breast  LATCH Score: 7   Lactation Tools Discussed/Used Tools: Pump;Flanges  Interventions Interventions: Education  Consult Status Consult Status: Follow-up Date: 02/21/24   Dahlia Byes Boschen  RN, IBCLC 02/21/2024, 9:15 AM

## 2024-02-22 LAB — CBC WITH DIFFERENTIAL/PLATELET
Abs Immature Granulocytes: 0.08 10*3/uL — ABNORMAL HIGH (ref 0.00–0.07)
Basophils Absolute: 0 10*3/uL (ref 0.0–0.1)
Basophils Relative: 0 %
Eosinophils Absolute: 0 10*3/uL (ref 0.0–0.5)
Eosinophils Relative: 0 %
HCT: 29.2 % — ABNORMAL LOW (ref 36.0–46.0)
Hemoglobin: 10.1 g/dL — ABNORMAL LOW (ref 12.0–15.0)
Immature Granulocytes: 1 %
Lymphocytes Relative: 19 %
Lymphs Abs: 2.6 10*3/uL (ref 0.7–4.0)
MCH: 31.8 pg (ref 26.0–34.0)
MCHC: 34.6 g/dL (ref 30.0–36.0)
MCV: 91.8 fL (ref 80.0–100.0)
Monocytes Absolute: 0.5 10*3/uL (ref 0.1–1.0)
Monocytes Relative: 4 %
Neutro Abs: 10.6 10*3/uL — ABNORMAL HIGH (ref 1.7–7.7)
Neutrophils Relative %: 76 %
Platelets: 152 10*3/uL (ref 150–400)
RBC: 3.18 MIL/uL — ABNORMAL LOW (ref 3.87–5.11)
RDW: 13.1 % (ref 11.5–15.5)
WBC: 13.8 10*3/uL — ABNORMAL HIGH (ref 4.0–10.5)
nRBC: 0 % (ref 0.0–0.2)

## 2024-02-22 LAB — COMPREHENSIVE METABOLIC PANEL WITH GFR
ALT: 17 U/L (ref 0–44)
AST: 43 U/L — ABNORMAL HIGH (ref 15–41)
Albumin: 2.4 g/dL — ABNORMAL LOW (ref 3.5–5.0)
Alkaline Phosphatase: 100 U/L (ref 38–126)
Anion gap: 10 (ref 5–15)
BUN: 14 mg/dL (ref 6–20)
CO2: 20 mmol/L — ABNORMAL LOW (ref 22–32)
Calcium: 8.9 mg/dL (ref 8.9–10.3)
Chloride: 104 mmol/L (ref 98–111)
Creatinine, Ser: 0.83 mg/dL (ref 0.44–1.00)
GFR, Estimated: >60 mL/min (ref >60)
Glucose, Bld: 82 mg/dL (ref 70–99)
Potassium: 4.1 mmol/L (ref 3.5–5.1)
Sodium: 134 mmol/L — ABNORMAL LOW (ref 135–145)
Total Bilirubin: 0.4 mg/dL (ref 0.0–1.2)
Total Protein: 5.6 g/dL — ABNORMAL LOW (ref 6.5–8.1)

## 2024-02-22 LAB — SURGICAL PATHOLOGY

## 2024-02-22 LAB — GLUCOSE, CAPILLARY: Glucose-Capillary: 81 mg/dL (ref 70–99)

## 2024-02-22 LAB — CBC
HCT: 32.1 % — ABNORMAL LOW (ref 36.0–46.0)
Hemoglobin: 11.1 g/dL — ABNORMAL LOW (ref 12.0–15.0)
MCH: 31.9 pg (ref 26.0–34.0)
MCHC: 34.6 g/dL (ref 30.0–36.0)
MCV: 92.2 fL (ref 80.0–100.0)
Platelets: 172 10*3/uL (ref 150–400)
RBC: 3.48 MIL/uL — ABNORMAL LOW (ref 3.87–5.11)
RDW: 13.1 % (ref 11.5–15.5)
WBC: 15.2 10*3/uL — ABNORMAL HIGH (ref 4.0–10.5)
nRBC: 0 % (ref 0.0–0.2)

## 2024-02-22 MED ORDER — NIFEDIPINE ER OSMOTIC RELEASE 30 MG PO TB24
60.0000 mg | ORAL_TABLET | Freq: Every day | ORAL | Status: DC
  Administered 2024-02-23 – 2024-02-25 (×3): 60 mg via ORAL
  Filled 2024-02-22 (×3): qty 2

## 2024-02-22 MED ORDER — NIFEDIPINE ER OSMOTIC RELEASE 30 MG PO TB24
30.0000 mg | ORAL_TABLET | Freq: Every day | ORAL | Status: DC
  Administered 2024-02-23 – 2024-02-24 (×2): 30 mg via ORAL
  Filled 2024-02-22 (×2): qty 1

## 2024-02-22 NOTE — Progress Notes (Signed)
 Patient seen in NICU with baby boy. Still with NG tube but decreasing D10 requirements. Working on feeding with bottle. Denies HA, SOB, CP, RUQ pain, visual changes. Minimal lochia. Some increasing incision pain after ambulation but this responds to pain meds.   Gen: NAD, sitting in chair with son CV: CTAB, RRR Abd: [place] CDI, NTTP MSK: neg edema/Homan's BL Psych/Neuro WNL  Repeat vitals taken by myself: temp 98, HR 60, O2 100%, BP 146/90. Reviewed BP med changes to Nif 60/30 in AM. Reviewed with patient to notify staff if PreE symptoms present. All questions answered. Patient in good condition

## 2024-02-22 NOTE — Progress Notes (Signed)
 Subjective: Postpartum Day 1: Cesarean Delivery Patient is feeling much improved this AM. No vision changes, RUQ pain or headache. Pain is controlled. Ambulating and voiding without issue. Tolerating PO without N/V. No flatus or BM yet.Baby boy in NICU for BG control. Reviewed holding circumcision until cleared by pediatrics   Objective: Patient Vitals for the past 24 hrs:  BP Temp Temp src Pulse Resp SpO2  02/22/24 0848 (!) 144/97 -- -- 68 -- 100 %  02/22/24 0714 137/81 -- -- -- -- --  02/22/24 0500 (!) 162/90 98.1 F (36.7 C) Oral (!) 42 16 97 %  02/21/24 2115 (!) 137/90 98.1 F (36.7 C) Oral (!) 52 18 99 %  02/21/24 1857 (!) 141/85 98.4 F (36.9 C) Oral (!) 57 -- 97 %  02/21/24 1639 -- 98.8 F (37.1 C) Oral -- -- 97 %  02/21/24 1356 137/78 -- -- (!) 50 -- --  02/21/24 1202 (!) 155/76 98.3 F (36.8 C) Oral (!) 52 18 98 %  02/21/24 1030 (!) 174/83 98.8 F (37.1 C) Oral (!) 55 18 98 %    Physical Exam:  General: alert, cooperative, and no distress, breastfeeding Lochia: appropriate Uterine Fundus: firm Incision: no significant drainage, no dehiscence, no significant erythema DVT Evaluation: No evidence of DVT seen on physical exam.  Recent Labs    02/21/24 0417 02/22/24 0715  HGB 13.2 11.1*  HCT 37.9 32.1*    Assessment/Plan: Anna Nelson [id number] POD#1 sp RCS at [redacted]w[redacted]d for preterm labor, history of cesarean 1. PPC: routine PP care, excellent urine output, can remove foley when able to ambulate.  2. Rh pos 3. CHTN: on procardia XL 30mg  BID, labs normal on admission. Initially every day however increased to BID yesterday. One severe range BP at 0500 with SBPs 160 however improved to mild range after 0800 dosing. Continue to monitor q3hrs. If persistently severe range, will give IV labetalol and consider magnesium for seizure prophylaxis.  4. A2GDM: fasting CBG WNL 5. Desires circumcision for baby boy, plan to complete prior to discharge. In NICU for BG control at this  time   [name] 02/22/2024, 10:03 AM

## 2024-02-22 NOTE — Progress Notes (Signed)
 RN called regarding elevated BP - patient is asymptomatic. Ambulating without issue, denies PreE symptoms. BP 156/108 > 168/97 (1805 > 1902). Lochia minimal, otherwise well. Evening Nifedipine 30XL dose given early at 1900. Stat CBC, Cmp ordered. Nifedipine dosing changed to 60/30, close eye on pressures. Patient visiting NICU without issue

## 2024-02-22 NOTE — Social Work (Signed)
 Patient screened out for psychosocial assessment since none of the following apply: Psychosocial stressors documented in mother or baby's chart Gestation less than 32 weeks Code at delivery  Infant with anomalies Please contact the Clinical Social Worker if specific needs arise, by MOB's request, or if MOB scores greater than 9/yes to question 10 on [place] Postpartum Depression Screen.  Vivi Barrack, MSW, LCSW Clinical Social Worker  [phone] 02/22/2024  9:17 AM

## 2024-02-22 NOTE — Lactation Note (Signed)
 This note was copied from a baby's chart.  NICU Lactation Consultation Note  Patient Name: Boy Leydy Worthey [id number] Date: 02/22/2024 Age:36 hours  Reason for consult: Follow-up assessment; NICU baby; Late-preterm 34-36.6wks; Infant < 5lbs; Other (Comment) (Chronic HBP, AMA) Type of Endocrine Disorder?: Diabetes (GDM on Metformin)  SUBJECTIVE  LC in to visit with P2 Mom of LPTI  that was transferred to NICU due to persistent hypoglycemia.  Baby has PIV and NG tube in place.  Baby took 55% PO.  Mom would like to breastfeed "CJ" and LC encouraged her to do STS when in NICU with baby.    Talked to Mom about newborn LPTI feeding behavior and the need to offer supplement as a bridge to exclusive breastfeeding.  Mom aware of lactation support available to her while baby is in the NICU.    Assisted Mom to pump with use of the hands free pumping band.  Instructed on use of the initiation setting and pump strength.  Encouraged breast massage and lots of STS with baby.  OBJECTIVE Infant data: Mother's Current Feeding Choice: Breast Milk and Formula  O2 Device: Room Air  Infant feeding assessment IDFTS - Readiness: 3 IDFTS - Quality: 3   Maternal data: [id number] C-Section, Low Transverse Has patient been taught Hand Expression?: Yes Hand Expression Comments: easy flow of colostrum noted Significant Breast History:: ++ breast changes Current breast feeding challenges:: Infant separation Previous breastfeeding challenges?: Lack of support; Other (Comment) (Had an appendectomy after first birth that sidelined her efforts) Does the patient have breastfeeding experience prior to this delivery?: Yes How long did the patient breastfeed?: a few days Pumping frequency: Mom pumping every 3 hrs Pumped volume: 5 mL Flange Size: 18 Hands-free pumping top sizes: Large Wallace Cullens) Risk factor for low/delayed milk supply:: Infant LPTI and <5lbs  Pump: DEBP, Personal (Has a Spectra from her first baby that  wasn't used, plans to get new pump parts for it)  ASSESSMENT Infant: Latch: Too sleepy or reluctant, no latch achieved, no sucking elicited. Audible Swallowing: None Type of Nipple: Everted at rest and after stimulation Comfort (Breast/Nipple): Soft / non-tender Hold (Positioning): Assistance needed to correctly position infant at breast and maintain latch. LATCH Score: 5  Feeding Status: Scheduled 9-12-3-6 Feeding method: Tube/Gavage (Bolus) Nipple Type: Nfant Slow Flow (purple)  Maternal: Milk volume: Normal  INTERVENTIONS/PLAN Interventions: Interventions: Breast feeding basics reviewed; Skin to skin; Breast massage; Hand express; DEBP; Coconut oil; Education; CDC Guidelines for Breast Pump Cleaning; NICU Pumping Log Tools: Pump; Flanges; Hands-free pumping top Pump Education: Setup, frequency, and cleaning; Milk Storage  Plan: 1- STS with baby when in NICU 2- Ask for help with positioning and latching prn 3- continue consistent pumping to support a full milk supply 4- ask for LC prn  Consult Status: NICU follow-up NICU Follow-up type: Verify onset of copious milk; Verify absence of engorgement   Judee Clara 02/22/2024, 1:41 PM

## 2024-02-24 MED ORDER — LABETALOL HCL 200 MG PO TABS
200.0000 mg | ORAL_TABLET | Freq: Two times a day (BID) | ORAL | Status: DC
  Administered 2024-02-24 – 2024-02-25 (×3): 200 mg via ORAL
  Filled 2024-02-24 (×3): qty 1

## 2024-02-24 NOTE — Progress Notes (Signed)
 Patient is doing well.  She is tolerating PO, ambulating, voiding.  Pain is controlled.  Lochia is appropriate.  Reports mild headache.   Vitals:   02/23/24 2033 02/23/24 2300 02/24/24 0510 02/24/24 0826  BP: (!) 150/90 139/86 139/89 (!) 136/98  Pulse: 83 80 68 75  Resp: 17  17   Temp: 98.3 F (36.8 C)  98 F (36.7 C) 98.9 F (37.2 C)  TempSrc: Oral  Oral   SpO2: 100%   100%  Weight:      Height:        NAD Abdomen:  soft, appropriate tenderness, incisions intact and without erythema or drainage ext:    Symmetric, no edema bilaterally  Lab Results  Component Value Date   WBC 13.8 (H) 02/22/2024   HGB 10.1 (L) 02/22/2024   HCT 29.2 (L) 02/22/2024   MCV 91.8 02/22/2024   PLT 152 02/22/2024    --/--/A POS (04/07 0413)  A/P    37 y.o. [id number] POD #3 s/p RCS at 35 weeks due to PTL Routine post op and postpartum care.   CHTN: home procardia XL 30mg  daily was increased to 60mg  qAM and 30 mg qPM.  Improved BP control, but still trending higher than baseline.  Discussed option of q2hr BP check today and adding in labetalol prn if continues to stay high or starting labetalol now--she would prefer to start now.  Labetalol 200 mg PO BID.  Will return from NICU at least q4hr for BP check today A2GDM: fasting CBG at goal Desires circumcision for baby boy, plan to complete prior to discharge.

## 2024-02-24 NOTE — Lactation Note (Signed)
 This note was copied from a baby's chart.  NICU Lactation Consultation Note  Patient Name: Boy Kearstin Learn HQION'G Date: 02/24/2024 Age:38 years  Reason for consult: Follow-up assessment; NICU baby; Infant < 5lbs; Late-preterm 34-36.6wks; Other (Comment); Mother's request; Breastfeeding assistance; RN request (AMA, cHTN) Type of Endocrine Disorder?: Diabetes (GDMA2 (metformin))  SUBJECTIVE Visited with family of 47 60/47 weeks old AGA NICU female "CJ"; NICU RN Logan asked lactation to assist with the 12 pm feeding. Ms. Peraza is a P2 but she's not very experienced with breast feeding, her plan is to do both, feedings at the breast along with pumping and bottle feeding. Baby "CJ" already awake and alert, he was doing some subtle feeding cues (head from side to side) prior latching. This LC took CJ to the R side in cross cradle hold and he was able to latch with ease but needed continuous stimulation for sucking; he started getting tired after 5 minutes taking longer breaks and falling asleep. Showed Ms. Tilson how to break the latch at the 9 minutes mark, she proceeded to burp baby and he woke up again and started cueing. Took him to the other breast and he nursed for a total of 17 minutes between both sides alternating NNS with NS, where NNS being the predominant sucking pattern. This is the first time baby is going to breast in NICU asked NICU RN Whitney Post to do a full gavage feeding. Reviewed pumping schedule, lactogenesis II, pump settings, IDF 1/2 and anticipatory guidelines. Provided bigger bottles for pumping per family request. She is still planning on getting  OBJECTIVE Infant data: Mother's Current Feeding Choice: Breast Milk and Formula  O2 Device: Room Air  Infant feeding assessment IDFTS - Readiness: 2 IDFTS - Quality: 2   Maternal data: [id number] C-Section, Low Transverse Pumping frequency: 5 times/24 hours Pumped volume: 40 mL (40-50 ml)  Pump: DEBP, Personal (Has a Spectra from  her first baby that wasn't used, plans to get new pump parts for it)  ASSESSMENT Infant: Latch: Repeated attempts needed to sustain latch, nipple held in mouth throughout feeding, stimulation needed to elicit sucking reflex. Audible Swallowing: A few with stimulation Type of Nipple: Everted at rest and after stimulation Comfort (Breast/Nipple): Soft / non-tender Hold (Positioning): Assistance needed to correctly position infant at breast and maintain latch. LATCH Score: 7  Feeding Status: Scheduled 9-12-3-6 Feeding method: Tube/Gavage (Bolus) Nipple Type: Dr. Levert Feinstein Preemie  Maternal: Milk volume: Normal  INTERVENTIONS/PLAN Interventions: Interventions: Breast feeding basics reviewed; Assisted with latch; Skin to skin; Breast massage; Breast compression; Adjust position; Support pillows; DEBP; Coconut oil; Education; Infant Driven Feeding Algorithm education  Plan: Pump both breasts on maintain mode every 3 hours for 30 minutes; ideally 8 pumping sessions/24 hours Take baby CJ to breast on feeding cues around feeding times and ask for latch assistance PRN Family will continue advancing on bottle feedings  FOB and MGM present and supportive. All questions and concerns answered, family to contact [facility] services PRN.  Consult Status: NICU follow-up NICU Follow-up type: Maternal D/C visit; Verify absence of engorgement   Breia Ocampo S Scarlett Portlock 02/24/2024, 1:37 PM

## 2024-02-25 ENCOUNTER — Other Ambulatory Visit (HOSPITAL_COMMUNITY): Payer: Self-pay

## 2024-02-25 MED ORDER — NIFEDIPINE ER 30 MG PO TB24
30.0000 mg | ORAL_TABLET | Freq: Every day | ORAL | 1 refills | Status: AC
  Filled 2024-02-25: qty 30, 30d supply, fill #0

## 2024-02-25 MED ORDER — LABETALOL HCL 200 MG PO TABS
200.0000 mg | ORAL_TABLET | Freq: Two times a day (BID) | ORAL | 1 refills | Status: AC
  Filled 2024-02-25: qty 60, 30d supply, fill #0

## 2024-02-25 MED ORDER — NIFEDIPINE ER 60 MG PO TB24
60.0000 mg | ORAL_TABLET | Freq: Every day | ORAL | 1 refills | Status: AC
  Filled 2024-02-25: qty 30, 30d supply, fill #0

## 2024-02-25 MED ORDER — IBUPROFEN 600 MG PO TABS
600.0000 mg | ORAL_TABLET | Freq: Four times a day (QID) | ORAL | 1 refills | Status: AC | PRN
  Filled 2024-02-25: qty 40, 10d supply, fill #0

## 2024-02-25 MED ORDER — OXYCODONE-ACETAMINOPHEN 5-325 MG PO TABS
1.0000 | ORAL_TABLET | ORAL | 0 refills | Status: AC | PRN
  Filled 2024-02-25: qty 28, 5d supply, fill #0

## 2024-02-25 NOTE — Progress Notes (Signed)
 Subjective: Postpartum Day 4: Cesarean Delivery Patient reports tolerating PO, + flatus, + BM, and no problems voiding.  She specifically denies CP, SOB, HA or blurry vision. She is pumping and getting good amounts of colustrum/milk. SOn, Irven Easterly, is doing well in NICU.   Objective: Vital signs in last 24 hours: Temp:  [97.9 F (36.6 C)-98.6 F (37 C)] 98.6 F (37 C) (04/10 2033) Pulse Rate:  [64-80] 76 (04/11 0315) Resp:  [18] 18 (04/10 2033) BP: (120-143)/(80-102) 125/89 (04/11 0315) SpO2:  [99 %] 99 % (04/10 1813)  Physical Exam:  General: alert, cooperative, and no distress Lochia: appropriate Uterine Fundus: firm Incision: no significant erythema DVT Evaluation: No evidence of DVT seen on physical exam.  Recent Labs    02/22/24 1956  HGB 10.1*  HCT 29.2*    Assessment/Plan: Status post Cesarean section. Doing well postoperatively.  Continue current care Chtn with AE preE: Will monitor BP today on new regimen of procardia 60mg  in am and 30mg  in pm + labetalol 200mg  bid . BP has been in normal range since midnight - RN to do another check now. If remains well controlled later today then likely discharge to home tonight vs tomorrow morning.   2.   GDMA2 - no pp meds. CBGs at goal   3.   Discussed circumcision for son when cleared by NICU. Pt reports understanding of the procedure.   Cathrine Muster, DO 02/25/2024, 8:48 AM

## 2024-02-25 NOTE — Lactation Note (Signed)
 This note was copied from a baby's chart.  NICU Lactation Consultation Note  Patient Name: Anna Nelson [id number] Date: 02/25/2024 Age:36 days  Reason for consult: Follow-up assessment; NICU baby; Late-preterm 34-36.6wks; Other (Comment); Breastfeeding assistance; Mother's request; RN request; Maternal discharge; Infant < 5lbs (AMA, cHTN) Type of Endocrine Disorder?: Diabetes (GDMA2 (metformin))  SUBJECTIVE Visited with family of 7 74/64 weeks old AGA NICU female for the 3 pm feeding assist; baby "CJ" just transitioned to [facility] algorithm. This LC took CJ to the R side in cross cradle hold and he was able to latch with ease but became very sleepy and difficult to arouse. Only a few audible swallows noted during this 3 minutes feeding timed with stopwatch. She has been pumping and also taking baby to breast. Ms. Norrod might be getting discharged tomorrow. Reviewed discharge education, pump settings and the importance of consistent pumping even after feedings/attempts at the breast to protect her supply. Explained that CJ is still too little to do it all, and that the pump will finish up the job at emptying her breasts.   OBJECTIVE Infant data: Mother's Current Feeding Choice: Breast Milk and Formula  O2 Device: Room Air  Infant feeding assessment IDFTS - Readiness: 2 IDFTS - Quality: 2   Maternal data: [id number] C-Section, Low Transverse Pumping frequency: 5 times/24 hours + 3 breastfeeding sessions Pumped volume: 60 mL  Pump: DEBP, Personal (Has a Spectra from her first baby that wasn't used, plans to get new pump parts for it)  ASSESSMENT Infant: Latch: Repeated attempts needed to sustain latch, nipple held in mouth throughout feeding, stimulation needed to elicit sucking reflex. Audible Swallowing: A few with stimulation Type of Nipple: Everted at rest and after stimulation Comfort (Breast/Nipple): Soft / non-tender Hold (Positioning): Assistance needed to correctly position  infant at breast and maintain latch. LATCH Score: 7  Feeding Status: Scheduled 9-12-3-6 Feeding method: Breast Nipple Type: Dr. Levert Feinstein Preemie  Maternal: Milk volume: Normal  INTERVENTIONS/PLAN Interventions: Interventions: Breast feeding basics reviewed; Assisted with latch; Skin to skin; Breast massage; Breast compression; Adjust position; Support pillows; Coconut oil; DEBP; Education; Visual merchandiser education Discharge Education: Engorgement and breast care  Plan: Pump both breasts on maintain mode every 3 hours for 30 minutes; ideally 8 pumping sessions/24 hours Bring all pump pieces to baby's room after her discharge Take baby CJ to breast on feeding cues around feeding times and ask for latch assistance PRN Family will continue advancing on bottle feedings   No other support person at this time. All questions and concerns answered, family to contact [facility] services PRN.  Consult Status: NICU follow-up NICU Follow-up type: Verify absence of engorgement; Weekly NICU follow up   Treacy Holcomb S Philis Nettle 02/25/2024, 4:04 PM

## 2024-02-25 NOTE — Progress Notes (Signed)
 Patient ID: Anna Nelson, female   DOB: 08-06-1986, 38 y.o.   MRN: [id number] BP well controlled today. Pt reports continues to feel well and denies any complaints.  She has spent a lot of time with baby in NICU and plans to room in with him when discharged.  PreE precautions reviewed. Also knows to call if gets lightheaded after taking BP meds on new regimen   We discussed indications for, and expectations with circumcision.  All questions answered and consent signed  Will discharge with rx sent to hospital pharmacy

## 2024-02-25 NOTE — Discharge Instructions (Signed)
 Call office with any concerns [phone]

## 2024-02-25 NOTE — Discharge Summary (Signed)
 Postpartum Discharge Summary  Date of Service updated      Patient Name: Anna Nelson DOB: [birth year] MRN: [id number]  Date of admission: 02/21/2024 Delivery date:02/21/2024 Delivering provider: Truett Perna A Date of discharge: 02/25/2024  Admitting diagnosis: Pregnant and not yet delivered in third trimester [Z34.93] Intrauterine pregnancy: [redacted]w[redacted]d     Secondary diagnosis:  Principal Problem:   Pregnant and not yet delivered in third trimester  Additional problems: preterm premature rupture of membranes, chtn, GDMA2, previous cesarean section, AMA, marginal cord insertion     Discharge diagnosis:  same                                                Post partum procedures: n/a Augmentation: N/A Complications: None  Hospital course: Sceduled C/S   38 y.o. yo [id number] at [redacted]w[redacted]d was admitted to the hospital 02/21/2024 for scheduled cesarean section with the following indication:Elective Repeat and after preterm premature rupture of membranes  .Delivery details are as follows:  Membrane Rupture Time/Date:  ,   Delivery Method:C-Section, Low Transverse Operative Delivery:N/A Details of operation can be found in separate operative note.  Patient had a postpartum course complicated by AE chtn   She is ambulating, tolerating a regular diet, passing flatus, and urinating well. Patient is discharged home in stable condition on  02/25/24        Newborn Data: Birth date:02/21/2024 Birth time:5:16 AM Gender:Female Living status:Living Apgars:8 ,9  Weight:2240 g    Magnesium Sulfate received: No BMZ received: No Rhophylac:N/A MMR:N/A T-DaP:Given prenatally Flu: N/A RSV Vaccine received: No Transfusion:No Immunizations administered: There is no immunization history for the selected administration types on file for this patient.  Physical exam  Vitals:   02/25/24 0054 02/25/24 0315 02/25/24 0911 02/25/24 1254  BP: 120/80 125/89 (!) 137/90 131/79  Pulse: 64 76 78 69  Resp:   18 18   Temp:   98.4 F (36.9 C) 98.4 F (36.9 C)  TempSrc:   Oral Oral  SpO2:   99% 97%  Weight:      Height:       General: alert, cooperative, and no distress Lochia: appropriate Uterine Fundus: firm Incision: No significant erythema DVT Evaluation: No evidence of DVT seen on physical exam. Labs: Lab Results  Component Value Date   WBC 13.8 (H) 02/22/2024   HGB 10.1 (L) 02/22/2024   HCT 29.2 (L) 02/22/2024   MCV 91.8 02/22/2024   PLT 152 02/22/2024      Latest Ref Rng & Units 02/22/2024    7:56 PM  CMP  Glucose 70 - 99 mg/dL 82   BUN 6 - 20 mg/dL 14   Creatinine 1.47 - 1.00 mg/dL 8.29   Sodium 562 - 130 mmol/L 134   Potassium 3.5 - 5.1 mmol/L 4.1   Chloride 98 - 111 mmol/L 104   CO2 22 - 32 mmol/L 20   Calcium 8.9 - 10.3 mg/dL 8.9   Total Protein 6.5 - 8.1 g/dL 5.6   Total Bilirubin 0.0 - 1.2 mg/dL 0.4   Alkaline Phos 38 - 126 U/L 100   AST 15 - 41 U/L 43   ALT 0 - 44 U/L 17    Edinburgh Score:    02/23/2024    8:37 PM  Edinburgh Postnatal Depression Scale Screening Tool  I have been able to laugh and see  the funny side of things. 0  I have looked forward with enjoyment to things. 0  I have blamed myself unnecessarily when things went wrong. 2  I have been anxious or worried for no good reason. 2  I have felt scared or panicky for no good reason. 1  Things have been getting on top of me. 1  I have been so unhappy that I have had difficulty sleeping. 0  I have felt sad or miserable. 0  I have been so unhappy that I have been crying. 0  The thought of harming myself has occurred to me. 0  Edinburgh Postnatal Depression Scale Total 6      After visit meds:  Allergies as of 02/25/2024   No Known Allergies      Medication List     STOP taking these medications    acetaminophen 500 MG tablet Commonly known as: TYLENOL   metformin 500 MG (OSM) 24 hr tablet Commonly known as: FORTAMET       TAKE these medications    docusate sodium 100 MG  capsule Commonly known as: COLACE Take 100 mg by mouth daily as needed for mild constipation.   ferrous sulfate 325 (65 FE) MG tablet Take 325 mg by mouth daily with breakfast.   ibuprofen 600 MG tablet Commonly known as: ADVIL Take 1 tablet (600 mg total) by mouth every 6 (six) hours as needed for moderate pain (pain score 4-6) or cramping.   labetalol 200 MG tablet Commonly known as: NORMODYNE Take 1 tablet (200 mg total) by mouth 2 (two) times daily.   NIFEdipine 30 MG 24 hr tablet Commonly known as: ADALAT CC Take 1 tablet (30 mg total) by mouth daily. What changed: Another medication with the same name was added. Make sure you understand how and when to take each.   NIFEdipine 60 MG 24 hr tablet Commonly known as: ADALAT CC Take 1 tablet (60 mg total) by mouth daily. Start taking on: February 26, 2024 What changed: You were already taking a medication with the same name, and this prescription was added. Make sure you understand how and when to take each.   ondansetron 8 MG disintegrating tablet Commonly known as: ZOFRAN-ODT Take 1 tablet (8 mg total) by mouth every 8 (eight) hours as needed for nausea or vomiting.   oxyCODONE-acetaminophen 5-325 MG tablet Commonly known as: Percocet Take 1 tablet by mouth every 4 (four) hours as needed for up to 7 days for severe pain (pain score 7-10).   prenatal multivitamin Tabs tablet Take 1 tablet by mouth daily at 12 noon.   promethazine 12.5 MG tablet Commonly known as: PHENERGAN Take 12.5 mg by mouth every 6 (six) hours as needed for nausea or vomiting.   tamsulosin 0.4 MG Caps capsule Commonly known as: FLOMAX Take 1 capsule (0.4 mg total) by mouth daily after supper.               Discharge Care Instructions  (From admission, onward)           Start     Ordered   02/25/24 0000  Discharge wound care:       Comments: May shower, pat dry . Keep clean   02/25/24 1715   02/25/24 0000  If the dressing is still on  your incision site when you go home, remove it on the third day after your surgery date. Remove dressing if it begins to fall off, or if it is dirty or damaged  before the third day.        02/25/24 1715             Discharge home in stable condition Infant Feeding: Bottle and Breast Infant Disposition:NICU Discharge instruction: per After Visit Summary and Postpartum booklet. Activity: Advance as tolerated. Pelvic rest for 6 weeks.  Diet: carb modified diet and low salt diet Anticipated Birth Control: Unsure Postpartum Appointment:6 weeks Additional Postpartum F/U: Postpartum Depression checkup, 2 hour GTT, Incision check 1 week, and BP check 1 week Future Appointments:No future appointments. Follow up Visit:  Follow-up Information     Ob/Gyn, Nestor Ramp. Schedule an appointment as soon as possible for a visit in 1 week(s).   Why: BP  and incision check by 03/01/24 Postpartum visit in 6 weeks Contact information: [address] [place] [address] [phone]         [facility] .   Specialty: Internal Medicine Contact information: [address] [place] [address] [phone] Additional information: [address]  [place], [place] [address]                    02/25/2024 Cathrine Muster, DO

## 2024-03-03 ENCOUNTER — Telehealth (HOSPITAL_COMMUNITY): Payer: Self-pay | Admitting: *Deleted

## 2024-03-03 NOTE — Telephone Encounter (Signed)
 03/03/2024  Name: Anna Nelson MRN: [id number] DOB: [birth year]  Reason for Call:  Transition of Care Hospital Discharge Call  Contact Status: Patient Contact Status: Message  Language assistant needed:          Follow-Up Questions:    Dimple Francis Postnatal Depression Scale:  In the Past 7 Days:    PHQ2-9 Depression Scale:     Discharge Follow-up:    Post-discharge interventions: NA  Pearlie Bougie, RN 03/03/2024 09:45

## 2024-03-04 ENCOUNTER — Ambulatory Visit (HOSPITAL_COMMUNITY): Payer: Self-pay

## 2024-03-04 NOTE — Lactation Note (Signed)
 This note was copied from a baby's chart.  NICU Lactation Consultation Note  Patient Name: Boy Neely Kammerer [id number] Date: 03/04/2024 Age:36 days  Reason for consult: Weekly NICU follow-up; NICU baby; Infant < 6lbs; Early term 37-38.6wks; Other (Comment) (AMA, cHTN) Type of Endocrine Disorder?: Diabetes (GDMA2 (metformin))  SUBJECTIVE Visited with family of 62 36/60 weeks old AGA NICU female; Ms. Kubly has been pumping consistently for baby "CJ" and only missing a couple of pumping sessions but her supply remains WNL. She has been primarily working on pumping and bottle feeding during NICU stay but her plan is to also take baby CJ to breast after his discharge. Ms. Cramer is taking baby home today. Reviewed discharge education and the importance of consistent pumping even after feedings/attempts at the breast to protect her supply or whenever baby is getting a bottle. She politely declined a referral for LC OP F/U but has their contact info in case she needs to reach out. No other support person at this time. All questions and concerns answered, family to contact [facility] services PRN.  OBJECTIVE Infant data: Mother's Current Feeding Choice: Breast Milk and Formula  O2 Device: Room Air  Infant feeding assessment IDFTS - Readiness: 1 IDFTS - Quality: 2   Maternal data: [id number] C-Section, Low Transverse Pumping frequency: 5-6 times/24 hours Pumped volume: 120 mL (120-165 ml)  Pump: Manual (Offered loaner pump on 03/04/2024 (baby's discharge); she still hasn't gotten the spare pieces she needed for her Spectra  pump)  ASSESSMENT Infant: Feeding Status: Ad lib Feeding method: Bottle Nipple Type: Dr. Leticia Raven Preemie  Maternal: Milk volume: Normal  INTERVENTIONS/PLAN Interventions: Interventions: Breast feeding basics reviewed; Coconut oil; DEBP; Education Discharge Education: Outpatient recommendation  Plan: Consult Status: Complete   Daysen Gundrum S Breonna Gafford 03/04/2024, 1:13 PM

## 2024-03-07 ENCOUNTER — Inpatient Hospital Stay (HOSPITAL_COMMUNITY): Admit: 2024-03-07 | Admitting: Obstetrics and Gynecology

## 2024-03-07 ENCOUNTER — Encounter (HOSPITAL_COMMUNITY): Payer: Self-pay

## 2024-03-07 SURGERY — Surgical Case
Anesthesia: Regional

## 2024-04-05 ENCOUNTER — Other Ambulatory Visit (HOSPITAL_COMMUNITY): Payer: Self-pay
